# Patient Record
Sex: Female | Born: 1970 | Race: White | Hispanic: No | Marital: Married | Smoking: Former smoker
Health system: Southern US, Community
[De-identification: ages and names within clinical notes are randomized; demographics above are authoritative.]

## PROBLEM LIST (undated history)

## (undated) DIAGNOSIS — M7500 Adhesive capsulitis of unspecified shoulder: Secondary | ICD-10-CM

## (undated) DIAGNOSIS — E039 Hypothyroidism, unspecified: Secondary | ICD-10-CM

## (undated) DIAGNOSIS — M25519 Pain in unspecified shoulder: Secondary | ICD-10-CM

## (undated) DIAGNOSIS — N764 Abscess of vulva: Principal | ICD-10-CM

## (undated) DIAGNOSIS — E079 Disorder of thyroid, unspecified: Secondary | ICD-10-CM

## (undated) DIAGNOSIS — R519 Headache, unspecified: Secondary | ICD-10-CM

## (undated) DIAGNOSIS — Z9889 Other specified postprocedural states: Secondary | ICD-10-CM

## (undated) DIAGNOSIS — Z8619 Personal history of other infectious and parasitic diseases: Secondary | ICD-10-CM

## (undated) DIAGNOSIS — I890 Lymphedema, not elsewhere classified: Secondary | ICD-10-CM

## (undated) DIAGNOSIS — A599 Trichomoniasis, unspecified: Secondary | ICD-10-CM

## (undated) DIAGNOSIS — R87619 Unspecified abnormal cytological findings in specimens from cervix uteri: Secondary | ICD-10-CM

## (undated) HISTORY — DX: Lymphedema, not elsewhere classified: I89.0

## (undated) HISTORY — PX: OTHER SURGICAL HISTORY: SHX169

## (undated) HISTORY — DX: Other specified postprocedural states: Z98.890

## (undated) HISTORY — DX: Adhesive capsulitis of unspecified shoulder: M75.00

## (undated) HISTORY — PX: KNEE SURGERY: SHX244

## (undated) HISTORY — DX: Trichomoniasis, unspecified: A59.9

## (undated) HISTORY — DX: Disorder of thyroid, unspecified: E07.9

## (undated) HISTORY — DX: Unspecified abnormal cytological findings in specimens from cervix uteri: R87.619

## (undated) HISTORY — DX: Headache, unspecified: R51.9

## (undated) HISTORY — DX: Hypothyroidism, unspecified: E03.9

## (undated) HISTORY — DX: Personal history of other infectious and parasitic diseases: Z86.19

## (undated) HISTORY — PX: TONSILLECTOMY AND ADENOIDECTOMY: SHX28

## (undated) HISTORY — DX: Abscess of vulva: N76.4

## (undated) HISTORY — DX: Pain in unspecified shoulder: M25.519

---

## 2013-04-14 ENCOUNTER — Encounter: Payer: Self-pay | Admitting: Family Medicine

## 2014-05-10 ENCOUNTER — Encounter: Payer: Self-pay | Admitting: *Deleted

## 2014-05-10 ENCOUNTER — Encounter: Payer: Self-pay | Admitting: Obstetrics & Gynecology

## 2014-07-08 DIAGNOSIS — E66811 Obesity, class 1: Secondary | ICD-10-CM | POA: Insufficient documentation

## 2014-07-08 DIAGNOSIS — M1990 Unspecified osteoarthritis, unspecified site: Secondary | ICD-10-CM | POA: Insufficient documentation

## 2014-11-09 DIAGNOSIS — Z903 Acquired absence of stomach [part of]: Secondary | ICD-10-CM | POA: Insufficient documentation

## 2014-11-09 DIAGNOSIS — K219 Gastro-esophageal reflux disease without esophagitis: Secondary | ICD-10-CM | POA: Insufficient documentation

## 2015-02-02 ENCOUNTER — Other Ambulatory Visit: Payer: Self-pay | Admitting: Adult Health

## 2015-02-08 ENCOUNTER — Ambulatory Visit (INDEPENDENT_AMBULATORY_CARE_PROVIDER_SITE_OTHER): Payer: BLUE CROSS/BLUE SHIELD | Admitting: Adult Health

## 2015-02-08 ENCOUNTER — Other Ambulatory Visit (HOSPITAL_COMMUNITY)
Admission: RE | Admit: 2015-02-08 | Discharge: 2015-02-08 | Disposition: A | Discharge status: Home / Self Care | Payer: BLUE CROSS/BLUE SHIELD | Source: Ambulatory Visit | Attending: Adult Health | Admitting: Adult Health

## 2015-02-08 ENCOUNTER — Encounter: Payer: Self-pay | Admitting: Adult Health

## 2015-02-08 VITALS — BP 104/60 | HR 64 | Ht 65.5 in | Wt 213.5 lb

## 2015-02-08 DIAGNOSIS — Z1212 Encounter for screening for malignant neoplasm of rectum: Secondary | ICD-10-CM

## 2015-02-08 DIAGNOSIS — Z113 Encounter for screening for infections with a predominantly sexual mode of transmission: Secondary | ICD-10-CM | POA: Insufficient documentation

## 2015-02-08 DIAGNOSIS — Z01419 Encounter for gynecological examination (general) (routine) without abnormal findings: Secondary | ICD-10-CM | POA: Insufficient documentation

## 2015-02-08 DIAGNOSIS — E039 Hypothyroidism, unspecified: Secondary | ICD-10-CM | POA: Insufficient documentation

## 2015-02-08 DIAGNOSIS — Z1151 Encounter for screening for human papillomavirus (HPV): Secondary | ICD-10-CM | POA: Diagnosis present

## 2015-02-08 HISTORY — DX: Hypothyroidism, unspecified: E03.9

## 2015-02-08 LAB — HEMOCCULT GUIAC POC 1CARD (OFFICE): Fecal Occult Blood, POC: NEGATIVE

## 2015-02-08 MED ORDER — LEVOTHYROXINE SODIUM 50 MCG PO TABS: 50.0000 ug | ORAL_TABLET | Freq: Every day | ORAL | Status: DC

## 2015-02-08 NOTE — Patient Instructions (Signed)
Physical in 1 year Pap in 3 years if normal and negative HPV Mammogram yearly

## 2015-02-08 NOTE — Progress Notes (Signed)
Patient ID: Ashley MendsCharlene Ray, female   DOB: 01/20/1971, 44 y.o.   MRN: [id number] History of Present Illness:  Westley HummerCharlene is a 44 year old white female, married in for well woman gyn exam and pap,she is new to this practice. She moved to [place], has no PCP yet.  Current Medications, Allergies, Past Medical History, Past Surgical History, Family History and Social History were reviewed in [facility] electronic medical record.     Review of Systems: Patient denies any headaches, hearing loss, fatigue, blurred vision, shortness of breath, chest pain, abdominal pain, problems with bowel movements, urination, or intercourse. No joint pain or mood swings.She had sleeve surgery in April.Has lost about 40 lbs.    Physical Exam:BP 104/60 mmHg  Pulse 64  Ht 5' 5.5" (1.664 m)  Wt 213 lb 8 oz (96.843 kg)  BMI 34.98 kg/m2  LMP 02/01/2015 General:  Well developed, well nourished, no acute distress Skin:  Warm and dry,tan Neck:  Midline trachea, normal thyroid, good ROM, no lymphadenopathy Lungs; Clear to auscultation bilaterally Breast:  No dominant palpable mass, retraction, or nipple discharge Cardiovascular: Regular rate and rhythm Abdomen:  Soft, non tender, no hepatosplenomegaly Pelvic:  External genitalia is normal in appearance, no lesions.  The vagina is normal in appearance. Urethra has no lesions or masses. The cervix is bulbous.Pap with HPV and GC/CHL performed. Uterus is felt to be normal size, shape, and contour.  No adnexal masses or tenderness noted.Bladder is non tender, no masses felt. Rectal: Good sphincter tone, no polyps, or hemorrhoids felt.  Hemoccult negative. Extremities/musculoskeletal:  No swelling or varicosities noted, no clubbing or cyanosis Psych:  No mood changes, alert and cooperative,seems happy   Impression: Well woman gyn exam with pap Hypothyroid     Plan: Check TSH Refilled synthroid 50 mcg #90 take 1 daily no refills Physical in 1  year Pap in 3 years of normal with negative HPV Mammogram yearly, [place] one in [place] Colonoscopy at 50

## 2015-02-09 ENCOUNTER — Telehealth: Payer: Self-pay | Admitting: Adult Health

## 2015-02-09 LAB — TSH: TSH: 1.59 u[IU]/mL (ref 0.450–4.500)

## 2015-02-09 LAB — CYTOLOGY - PAP

## 2015-02-09 NOTE — Telephone Encounter (Signed)
Left message TSH was good, cal if needs refills on meds

## 2015-04-11 ENCOUNTER — Telehealth: Payer: Self-pay | Admitting: *Deleted

## 2015-04-11 NOTE — Telephone Encounter (Signed)
No voice mail box.

## 2015-04-11 NOTE — Telephone Encounter (Signed)
Pt aware pap normal, she noticed husband had white spot in underwear, could be semen, if he has any itching or burning needs to be checked

## 2015-07-05 ENCOUNTER — Encounter: Payer: Self-pay | Admitting: Adult Health

## 2015-07-05 ENCOUNTER — Telehealth: Payer: Self-pay | Admitting: Adult Health

## 2015-07-05 ENCOUNTER — Ambulatory Visit (INDEPENDENT_AMBULATORY_CARE_PROVIDER_SITE_OTHER): Payer: BLUE CROSS/BLUE SHIELD | Admitting: Adult Health

## 2015-07-05 ENCOUNTER — Ambulatory Visit: Payer: BLUE CROSS/BLUE SHIELD | Admitting: Women's Health

## 2015-07-05 VITALS — BP 104/80 | HR 62 | Ht 65.0 in | Wt 186.0 lb

## 2015-07-05 DIAGNOSIS — Z139 Encounter for screening, unspecified: Secondary | ICD-10-CM

## 2015-07-05 DIAGNOSIS — N764 Abscess of vulva: Secondary | ICD-10-CM | POA: Insufficient documentation

## 2015-07-05 DIAGNOSIS — M25511 Pain in right shoulder: Secondary | ICD-10-CM | POA: Diagnosis not present

## 2015-07-05 DIAGNOSIS — M25519 Pain in unspecified shoulder: Secondary | ICD-10-CM | POA: Insufficient documentation

## 2015-07-05 HISTORY — DX: Pain in unspecified shoulder: M25.519

## 2015-07-05 HISTORY — DX: Abscess of vulva: N76.4

## 2015-07-05 MED ORDER — CYCLOBENZAPRINE HCL 5 MG PO TABS: 5.0000 mg | ORAL_TABLET | Freq: Three times a day (TID) | ORAL | Status: DC | PRN

## 2015-07-05 MED ORDER — SULFAMETHOXAZOLE-TRIMETHOPRIM 800-160 MG PO TABS: 1.0000 | ORAL_TABLET | Freq: Two times a day (BID) | ORAL | Status: DC

## 2015-07-05 NOTE — Progress Notes (Signed)
Subjective:     Patient ID: Ashley Ray, female   DOB: [birth year], 44 y.o.   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  Review of Systems Patient denies any headaches, hearing loss, fatigue, blurred vision, shortness of breath, chest pain, abdominal pain, problems with bowel movements, urination, or intercourse. No joint pain or mood swings.See HPI for positives. Reviewed past medical,surgical, social and family history. Reviewed medications and allergies.     Objective:   Physical Exam BP 104/80 mmHg  Pulse 62  Ht 5\' 5"  (1.651 m)  Wt 186 lb (84.369 kg)  BMI 30.95 kg/m2  LMP 06/11/2015 (Approximate) Skin warm and dry.Pelvic: external genitalia is normal in appearance, except has abscess right inner labia that has popped, still red,vagina: has good color, moisture and rugae,urethra has no lesions or masses noted, cervix:smooth and bulbous, uterus: normal size, shape and contour, non tender, no masses felt, adnexa: no masses or tenderness noted. Bladder is non tender and no masses felt, has good ROM right shoulder, waist 32.5 inches, form filled out for insurance and will get labs for insurance too.    Assessment:     Vulva abscess Pain in right shoulder area    Plan:     Try ice 10 minutes 3-4 x daily prn, see Dr Ladona Ridgelaylor, chiropractor  Rx flexeril 5 mg #30 take 1 tid prn no refills Rx septra ds #20 take 1 bid x 10 days, no refills Check CBC,CMP, and lipids, will talk in am about labs    Follow up prn

## 2015-07-05 NOTE — Patient Instructions (Signed)
Try ice  To shoulder 10 minutes on about 3-4 times daily prn Take flexeril prn  See Dr Ladona Ridgelaylor  Follow up prn

## 2015-07-06 LAB — LIPID PANEL
Chol/HDL Ratio: 1.9 ratio units (ref 0.0–4.4)
Cholesterol, Total: 137 mg/dL (ref 100–199)
HDL: 71 mg/dL (ref >39)
LDL Calculated: 52 mg/dL (ref 0–99)
Triglycerides: 69 mg/dL (ref 0–149)
VLDL CHOLESTEROL CAL: 14 mg/dL (ref 5–40)

## 2015-07-06 LAB — CBC
HEMOGLOBIN: 13.5 g/dL (ref 11.1–15.9)
Hematocrit: 40.3 % (ref 34.0–46.6)
MCH: 29.5 pg (ref 26.6–33.0)
MCHC: 33.5 g/dL (ref 31.5–35.7)
MCV: 88 fL (ref 79–97)
Platelets: 228 10*3/uL (ref 150–379)
RBC: 4.58 x10E6/uL (ref 3.77–5.28)
RDW: 13.3 % (ref 12.3–15.4)
WBC: 5.4 10*3/uL (ref 3.4–10.8)

## 2015-07-06 LAB — COMPREHENSIVE METABOLIC PANEL
ALK PHOS: 74 IU/L (ref 39–117)
ALT: 23 IU/L (ref 0–32)
AST: 18 IU/L (ref 0–40)
Albumin/Globulin Ratio: 1.6 (ref 1.1–2.5)
Albumin: 4.2 g/dL (ref 3.5–5.5)
BUN/Creatinine Ratio: 21 (ref 9–23)
BUN: 15 mg/dL (ref 6–24)
Bilirubin Total: 0.5 mg/dL (ref 0.0–1.2)
CO2: 25 mmol/L (ref 18–29)
CREATININE: 0.72 mg/dL (ref 0.57–1.00)
Calcium: 9.3 mg/dL (ref 8.7–10.2)
Chloride: 102 mmol/L (ref 96–106)
GFR calc Af Amer: 118 mL/min/{1.73_m2} (ref >59)
GFR calc non Af Amer: 102 mL/min/{1.73_m2} (ref >59)
GLUCOSE: 90 mg/dL (ref 65–99)
Globulin, Total: 2.6 g/dL (ref 1.5–4.5)
Potassium: 4.6 mmol/L (ref 3.5–5.2)
Sodium: 138 mmol/L (ref 134–144)
Total Protein: 6.8 g/dL (ref 6.0–8.5)

## 2015-07-06 NOTE — Telephone Encounter (Signed)
Pt aware labs normal, will print her a copy

## 2015-07-26 ENCOUNTER — Telehealth: Payer: Self-pay | Admitting: Adult Health

## 2015-07-26 MED ORDER — SULFAMETHOXAZOLE-TRIMETHOPRIM 800-160 MG PO TABS: 1.0000 | ORAL_TABLET | Freq: Two times a day (BID) | ORAL | Status: DC

## 2015-07-26 NOTE — Telephone Encounter (Signed)
Pt called stating that she would like a phone call from CanbyJennifer regarding the same issues she had last time. Please contact pt

## 2015-07-26 NOTE — Telephone Encounter (Signed)
Has another boil and it popped, will rx septra ds bid for 14 days, if returns make appt

## 2015-10-27 ENCOUNTER — Telehealth: Payer: Self-pay | Admitting: Adult Health

## 2015-10-27 MED ORDER — LEVOTHYROXINE SODIUM 50 MCG PO TABS: 50.0000 ug | ORAL_TABLET | Freq: Every day | ORAL | Status: DC

## 2015-10-27 NOTE — Telephone Encounter (Signed)
Will refill synthroid, check labs in July

## 2015-10-27 NOTE — Telephone Encounter (Signed)
Spoke with pt. Pt needs a refill on thyroid med. Thanks!! JSY

## 2016-02-06 ENCOUNTER — Other Ambulatory Visit: Payer: Self-pay | Admitting: Adult Health

## 2016-03-07 ENCOUNTER — Encounter: Payer: Self-pay | Admitting: Adult Health

## 2016-03-07 ENCOUNTER — Ambulatory Visit (INDEPENDENT_AMBULATORY_CARE_PROVIDER_SITE_OTHER): Payer: BLUE CROSS/BLUE SHIELD | Admitting: Adult Health

## 2016-03-07 VITALS — BP 110/70 | HR 64 | Ht 66.0 in | Wt 172.0 lb

## 2016-03-07 DIAGNOSIS — Z1211 Encounter for screening for malignant neoplasm of colon: Secondary | ICD-10-CM

## 2016-03-07 DIAGNOSIS — Z01419 Encounter for gynecological examination (general) (routine) without abnormal findings: Secondary | ICD-10-CM

## 2016-03-07 DIAGNOSIS — E039 Hypothyroidism, unspecified: Secondary | ICD-10-CM

## 2016-03-07 LAB — HEMOCCULT GUIAC POC 1CARD (OFFICE): FECAL OCCULT BLD: NEGATIVE

## 2016-03-07 MED ORDER — HYDROCORTISONE ACE-PRAMOXINE 1-1 % RE CREA: 1.0000 "application " | TOPICAL_CREAM | Freq: Two times a day (BID) | RECTAL | 0 refills | Status: DC

## 2016-03-07 NOTE — Patient Instructions (Signed)
Physical in 1 year Mammogram yearly [phone]  Call [facility] about legs

## 2016-03-07 NOTE — Progress Notes (Signed)
Patient ID: Ashley MendsCharlene Ray, female   DOB: 07/24/1970, 45 y.o.   MRN: [id number] History of Present Illness: Ashley HummerCharlene is a 45 year old white female in for a well woman gyn exam, she had a normal pap with negative HPV 01/29/15.She is complaining of rectal itch and also veins in legs.She is sp gastric sleeve and has lost about 86 lbs.   Current Medications, Allergies, Past Medical History, Past Surgical History, Family History and Social History were reviewed in [facility] electronic medical record.     Review of Systems: Patient denies any headaches, hearing loss, fatigue, blurred vision, shortness of breath, chest pain, abdominal pain, problems with bowel movements, urination, or intercourse. No joint pain or mood swings.See HPI for p[ositives.    Physical Exam:BP 110/70 (BP Location: Left Arm, Patient Position: Sitting, Cuff Size: Normal)   Pulse 64   Ht 5\' 6"  (1.676 m)   Wt 172 lb (78 kg)   LMP 02/10/2016 (Approximate)   BMI 27.76 kg/m  General:  Well developed, well nourished, no acute distress Skin:  Warm and dry,tan Neck:  Midline trachea, normal thyroid, good ROM, no lymphadenopathy Lungs; Clear to auscultation bilaterally Breast:  No dominant palpable mass, retraction, or nipple discharge Cardiovascular: Regular rate and rhythm Abdomen:  Soft, non tender, no hepatosplenomegaly Pelvic:  External genitalia is normal in appearance, no lesions.  The vagina is normal in appearance. Urethra has no lesions or masses. The cervix is bulbous.  Uterus is felt to be normal size, shape, and contour.  No adnexal masses or tenderness noted.Bladder is non tender, no masses felt. Rectal: Good sphincter tone, no polyps, or hemorrhoids felt.  Hemoccult negative. Extremities/musculoskeletal:  No swelling noted, no clubbing or cyanosis,she does have spider veins and varicose veins more on the left leg  Psych:  No mood changes, alert and cooperative,seems happy   Impression: Well woman exam  with routine gynecological exam - Plan: POCT occult blood stool  Hypothyroidism, unspecified hypothyroidism type - Plan: TSH     Plan: TSH Physical in 1 year Mammogram yearly Call [facility]  Continue current synthroid, will talk when labs back Use wet wipes and try analpram HC prn

## 2016-03-08 ENCOUNTER — Telehealth: Payer: Self-pay | Admitting: Adult Health

## 2016-03-08 LAB — TSH: TSH: 1.17 u[IU]/mL (ref 0.450–4.500)

## 2016-03-08 MED ORDER — LEVOTHYROXINE SODIUM 50 MCG PO TABS: 50.0000 ug | ORAL_TABLET | Freq: Every day | ORAL | 3 refills | Status: DC

## 2016-03-08 NOTE — Telephone Encounter (Signed)
Pt aware TSH normal, will refill synthroid x 1 year

## 2016-07-07 ENCOUNTER — Other Ambulatory Visit: Payer: Self-pay | Admitting: Adult Health

## 2016-08-10 ENCOUNTER — Telehealth: Payer: Self-pay | Admitting: *Deleted

## 2016-08-10 MED ORDER — LEVOTHYROXINE SODIUM 50 MCG PO TABS: 50.0000 ug | ORAL_TABLET | Freq: Every day | ORAL | 3 refills | Status: DC

## 2016-08-10 NOTE — Telephone Encounter (Signed)
Spoke with pt. Pt needs Levothyroxine sent to [facility]. It's cheaper to get there than local pharmacy. Thanks! JSY

## 2016-08-10 NOTE — Telephone Encounter (Signed)
[facility]

## 2016-10-08 ENCOUNTER — Telehealth: Payer: Self-pay | Admitting: Obstetrics & Gynecology

## 2016-10-08 NOTE — Telephone Encounter (Signed)
Pt would like to speak to someone re: possible yeast infection.

## 2016-10-08 NOTE — Telephone Encounter (Signed)
Has discharge redness and itching, to come in tomorrow at 11:15 am.

## 2016-10-08 NOTE — Telephone Encounter (Signed)
Patient called with complaints of yellow vaginal discharge, irritation, redness and itching. Patient is not able to be seen until next week. Please advise.

## 2016-10-09 ENCOUNTER — Ambulatory Visit (INDEPENDENT_AMBULATORY_CARE_PROVIDER_SITE_OTHER): Payer: BLUE CROSS/BLUE SHIELD | Admitting: Adult Health

## 2016-10-09 ENCOUNTER — Encounter: Payer: Self-pay | Admitting: Adult Health

## 2016-10-09 VITALS — BP 118/60 | HR 77 | Ht 66.0 in | Wt 175.5 lb

## 2016-10-09 DIAGNOSIS — B379 Candidiasis, unspecified: Secondary | ICD-10-CM

## 2016-10-09 DIAGNOSIS — N898 Other specified noninflammatory disorders of vagina: Secondary | ICD-10-CM

## 2016-10-09 DIAGNOSIS — L298 Other pruritus: Secondary | ICD-10-CM

## 2016-10-09 DIAGNOSIS — N951 Menopausal and female climacteric states: Secondary | ICD-10-CM | POA: Diagnosis not present

## 2016-10-09 LAB — POCT WET PREP (WET MOUNT): WBC, Wet Prep HPF POC: POSITIVE

## 2016-10-09 MED ORDER — FLUCONAZOLE 150 MG PO TABS: ORAL_TABLET | ORAL | 1 refills | Status: DC

## 2016-10-09 NOTE — Progress Notes (Signed)
Subjective:     Patient ID: Ashley Ray, female   DOB: [birth year], 46 y.o.   MRN: [id number]  HPI Ashley Ray is a 46 year old white female, married in complaining of vaginal discharge with itching and irritation, vaginal dryness and irregular periods.  Review of Systems +vaginal itching +vaginal discharge Vaginal dryness Periods irregular Reviewed past medical,surgical, social and family history. Reviewed medications and allergies.     Objective:   Physical Exam BP 118/60 (BP Location: Left Arm, Patient Position: Sitting, Cuff Size: Normal)   Pulse 77   Ht 5\' 6"  (1.676 m)   Wt 175 lb 8 oz (79.6 kg)   LMP 09/08/2016   BMI 28.33 kg/m    Skin warm and dry.Pelvic: external genitalia is normal in appearance no lesions,but red in creases, vagina: white discharge without odor,urethra has no lesions or masses noted, cervix:smooth and bulbous, uterus: normal size, shape and contour, non tender, no masses felt, adnexa: no masses or tenderness noted. Bladder is non tender and no masses felt. Wet prep: + for yeast and +WBCs.GC/CHL obtained. Discussed probably peri menopause, discussed other symptoms.  Assessment:     1. Vaginal discharge   2. Vaginal itching   3. Yeast infection   4.      Perimenopause    Plan:    GC/CHL sent Meds ordered this encounter  Medications  . fluconazole (DIFLUCAN) 150 MG tablet    Sig: Take 1 now and repeat 1 in 3 days    Dispense:  2 tablet    Refill:  1    Order Specific Question:   Supervising Provider    Answer:   Duane LopeEURE, LUTHER H [[id number]]  Use unscented soap, like dial antibacterial Follow up prn

## 2016-10-10 ENCOUNTER — Telehealth: Payer: Self-pay | Admitting: Adult Health

## 2016-10-10 LAB — GC/CHLAMYDIA PROBE AMP
Chlamydia trachomatis, NAA: NEGATIVE
NEISSERIA GONORRHOEAE BY PCR: NEGATIVE

## 2016-10-10 NOTE — Telephone Encounter (Signed)
Left message that GC/CHL both negative  

## 2016-10-15 ENCOUNTER — Telehealth: Payer: Self-pay | Admitting: Adult Health

## 2016-10-15 ENCOUNTER — Ambulatory Visit: Payer: BLUE CROSS/BLUE SHIELD | Admitting: Adult Health

## 2016-10-15 NOTE — Telephone Encounter (Signed)
Pt called stating that she was here on Tuesday of last week and was given a prescription and she has a question regarding her medication. Please contact pt

## 2016-10-15 NOTE — Telephone Encounter (Signed)
Left message that the cream can make feel more irritated and can burn , but if continues let me know

## 2016-10-15 NOTE — Telephone Encounter (Signed)
Spoke with pt. Pt was seen 3/20 and was prescribed Diflucan. She took both doses then refilled and took 1 dose of 2nd prescription. Pharmacist recommended Miconazole cream, 3 day treatment. She started cream last night. Pt feels like she is worse. Please advise. Thanks!! JSY

## 2016-10-16 DIAGNOSIS — I8311 Varicose veins of right lower extremity with inflammation: Secondary | ICD-10-CM | POA: Diagnosis not present

## 2016-10-16 DIAGNOSIS — I8312 Varicose veins of left lower extremity with inflammation: Secondary | ICD-10-CM | POA: Diagnosis not present

## 2016-10-16 DIAGNOSIS — R600 Localized edema: Secondary | ICD-10-CM | POA: Diagnosis not present

## 2016-10-22 ENCOUNTER — Telehealth: Payer: Self-pay | Admitting: Adult Health

## 2016-10-22 NOTE — Telephone Encounter (Signed)
Spoke with pt. Pt has a vaginal discharge with odor that started last Wednesday. I advised she needs to be seen. Pt voiced understanding and call was transferred to front desk for appt. JSY

## 2016-10-23 ENCOUNTER — Ambulatory Visit (INDEPENDENT_AMBULATORY_CARE_PROVIDER_SITE_OTHER): Payer: BLUE CROSS/BLUE SHIELD | Admitting: Adult Health

## 2016-10-23 ENCOUNTER — Encounter: Payer: Self-pay | Admitting: Adult Health

## 2016-10-23 VITALS — BP 110/70 | HR 66 | Ht 66.0 in | Wt 179.5 lb

## 2016-10-23 DIAGNOSIS — N898 Other specified noninflammatory disorders of vagina: Secondary | ICD-10-CM

## 2016-10-23 DIAGNOSIS — A599 Trichomoniasis, unspecified: Secondary | ICD-10-CM

## 2016-10-23 DIAGNOSIS — L298 Other pruritus: Secondary | ICD-10-CM | POA: Diagnosis not present

## 2016-10-23 LAB — POCT WET PREP (WET MOUNT)
CLUE CELLS WET PREP WHIFF POC: POSITIVE
WBC, Wet Prep HPF POC: POSITIVE

## 2016-10-23 MED ORDER — METRONIDAZOLE 500 MG PO TABS: 500.0000 mg | ORAL_TABLET | Freq: Three times a day (TID) | ORAL | 0 refills | Status: DC

## 2016-10-23 NOTE — Patient Instructions (Signed)

## 2016-10-23 NOTE — Progress Notes (Signed)
Subjective:     Patient ID: Ashley Ray, female   DOB: 02/01/1971, 46 y.o.   MRN: [id number]  HPI Carollynn is a 46 year old white female, married in complaining of increased vaginal discharge and odor, she took the diflucan and used OTC meds without relief.Was treated for yeast 10/09/16, and had negative GC/CHL then.  Review of Systems +vaginal discharge +vaginal itching +vaginal odor Reviewed past medical,surgical, social and family history. Reviewed medications and allergies.     Objective:   Physical Exam BP 110/70 (BP Location: Left Arm, Patient Position: Sitting, Cuff Size: Normal)   Pulse 66   Ht  (1.676 m)   Wt 179 lb 8 oz (81.4 kg)   LMP 10/10/2016 (Approximate)   BMI 28.97 kg/m    Skin warm and dry.Pelvic: external genitalia is normal in appearance no lesions, vagina: greenish discharge with odor,urethra has no lesions or masses noted, cervix: bulbous, slightly friable, uterus: normal size, shape and contour, non tender, no masses felt, adnexa: no masses or tenderness noted. Bladder is non tender and no masses felt. Wet prep: + for clue cells and +WBCs, +trich.Explained trich to her and treatment, and transmission, she is teary. Face time 15 minutes with 50% counseling.  Assessment:     1. Vaginal discharge   2. Vaginal itching   3. Vaginal odor   4. Trichimoniasis       Plan:     Meds ordered this encounter  Medications  . metroNIDAZOLE (FLAGYL) 500 MG tablet    Sig: Take 1 tablet (500 mg total) by mouth 3 (three) times daily.    Dispense:  21 tablet    Refill:  0    Order Specific Question:   Supervising Provider    Answer:   Freada Bergeron Rx for husband Tymesha Ditmore for flagyl 500 mg #4 4 po now No sex, no alcohol For POC 11/05/16, and check HIV and RPR Review handouts on trich

## 2016-10-24 ENCOUNTER — Ambulatory Visit (INDEPENDENT_AMBULATORY_CARE_PROVIDER_SITE_OTHER): Payer: BLUE CROSS/BLUE SHIELD | Admitting: Adult Health

## 2016-10-24 ENCOUNTER — Encounter: Payer: Self-pay | Admitting: Adult Health

## 2016-10-24 VITALS — BP 122/70 | HR 68 | Ht 66.0 in | Wt 175.0 lb

## 2016-10-24 DIAGNOSIS — A599 Trichomoniasis, unspecified: Secondary | ICD-10-CM | POA: Diagnosis not present

## 2016-10-24 DIAGNOSIS — N898 Other specified noninflammatory disorders of vagina: Secondary | ICD-10-CM

## 2016-10-24 DIAGNOSIS — L298 Other pruritus: Secondary | ICD-10-CM

## 2016-10-24 LAB — POCT WET PREP (WET MOUNT)
CLUE CELLS WET PREP WHIFF POC: POSITIVE
WBC WET PREP: POSITIVE

## 2016-10-24 NOTE — Progress Notes (Signed)
Subjective:     Patient ID: Ashley Ray, female   DOB: Oct 11, 1970, 46 y.o.   MRN: [id number]  HPI Ashley Ray is a 46 year old white female, married back today after being diagnosed with trich yesterday, wanting further testing for trich.Has not taken meds yet, has discharge, odor and itching.  Review of Systems Vaginal discharge with odor and itching Reviewed past medical,surgical, social and family history. Reviewed medications and allergies.     Objective:   Physical Exam BP 122/70 (BP Location: Left Arm, Patient Position: Sitting, Cuff Size: Normal)   Pulse 68   Ht  (1.676 m)   Wt 175 lb (79.4 kg)   LMP 10/10/2016 (Approximate)   BMI 28.25 kg/m  Skin warm and dry.Pelvic: external genitalia is normal in appearance no lesions, vagina: greenish frothy discharge with odor,urethra has no lesions or masses noted, cervix:smooth and bulbous, and friable with Q tip. Wet prep: + for trich and +WBCs. GC/CHL/TRICH obtained.    Discussed trich with her again and Dr Emelda Fear in to tell her that it could be dormant, then flare.   Assessment:     1. Vaginal discharge   2. Trichimoniasis   3. Vaginal itching   4. Vaginal odor       Plan:    GC/CHL/TRICH NAA sent Take flagyl F/U as scheduled

## 2016-10-26 ENCOUNTER — Telehealth: Payer: Self-pay | Admitting: Adult Health

## 2016-10-26 LAB — CHLAMYDIA/GONOCOCCUS/TRICHOMONAS, NAA
CHLAMYDIA BY NAA: NEGATIVE
Gonococcus by NAA: NEGATIVE
Trich vag by NAA: POSITIVE — AB

## 2016-10-26 NOTE — Telephone Encounter (Signed)
Left message that trich was + test that was sent to lab

## 2016-11-05 ENCOUNTER — Telehealth: Payer: Self-pay | Admitting: *Deleted

## 2016-11-05 NOTE — Telephone Encounter (Signed)
Patient called stating she has appointment tomorrow for f/u with Victorino Dike but states she is on her period.  Spoke with Victorino Dike who stated patient could reschedule for when her period stops. Pt verbalized understanding. Will reschedule.

## 2016-11-06 ENCOUNTER — Ambulatory Visit: Payer: BLUE CROSS/BLUE SHIELD | Admitting: Adult Health

## 2016-11-12 ENCOUNTER — Ambulatory Visit: Payer: BLUE CROSS/BLUE SHIELD | Admitting: Adult Health

## 2016-11-14 ENCOUNTER — Ambulatory Visit: Payer: BLUE CROSS/BLUE SHIELD | Admitting: Adult Health

## 2016-11-26 ENCOUNTER — Ambulatory Visit: Payer: BLUE CROSS/BLUE SHIELD | Admitting: Adult Health

## 2016-11-27 ENCOUNTER — Ambulatory Visit: Payer: BLUE CROSS/BLUE SHIELD | Admitting: Adult Health

## 2016-12-10 ENCOUNTER — Ambulatory Visit (INDEPENDENT_AMBULATORY_CARE_PROVIDER_SITE_OTHER): Payer: BLUE CROSS/BLUE SHIELD | Admitting: Adult Health

## 2016-12-10 ENCOUNTER — Encounter: Payer: Self-pay | Admitting: Adult Health

## 2016-12-10 ENCOUNTER — Encounter (INDEPENDENT_AMBULATORY_CARE_PROVIDER_SITE_OTHER): Payer: Self-pay

## 2016-12-10 DIAGNOSIS — Z8619 Personal history of other infectious and parasitic diseases: Secondary | ICD-10-CM

## 2016-12-10 DIAGNOSIS — Z113 Encounter for screening for infections with a predominantly sexual mode of transmission: Secondary | ICD-10-CM | POA: Diagnosis not present

## 2016-12-10 HISTORY — DX: Personal history of other infectious and parasitic diseases: Z86.19

## 2016-12-10 LAB — POCT WET PREP (WET MOUNT)
CLUE CELLS WET PREP WHIFF POC: NEGATIVE
Trichomonas Wet Prep HPF POC: ABSENT

## 2016-12-10 NOTE — Progress Notes (Addendum)
Subjective:     Patient ID: Ashley Ray, female   DOB: 01/13/1971, 46 y.o.   MRN: [id number]  HPI Ashley Ray is a 46 year old white female, married in for POC for recent trich infection, she and husband took meds, has less discharge.   Review of Systems Patient denies any headaches, hearing loss, fatigue, blurred vision, shortness of breath, chest pain, abdominal pain, problems with bowel movements, urination, or intercourse. No joint pain or mood swings.Less discharge  Reviewed past medical,surgical, social and family history. Reviewed medications and allergies.     Objective:   Physical Exam BP 110/68 (BP Location: Left Arm, Patient Position: Sitting, Cuff Size: Normal)   Pulse (!) 51   Ht 5\' 6"  (1.676 m)   Wt 181 lb (82.1 kg)   LMP 12/06/2016   BMI 29.21 kg/m    Skin warm and dry.Pelvic: external genitalia is normal in appearance no lesions, vagina:scant white discharge without odor,urethra has no lesions or masses noted, cervix:smooth and bulbous, uterus: normal size, shape and contour, non tender, no masses felt, adnexa: no masses or tenderness noted. Bladder is non tender and no masses felt. Wet prep: few +WBCs, no clue cells or trich.   Assessment:     History of trichomoniasis, for proof of cure    Plan:     Follow up prn

## 2016-12-26 DIAGNOSIS — Z6828 Body mass index (BMI) 28.0-28.9, adult: Secondary | ICD-10-CM | POA: Diagnosis not present

## 2016-12-26 DIAGNOSIS — Z9884 Bariatric surgery status: Secondary | ICD-10-CM | POA: Diagnosis not present

## 2016-12-26 DIAGNOSIS — E663 Overweight: Secondary | ICD-10-CM | POA: Diagnosis not present

## 2016-12-26 DIAGNOSIS — Z903 Acquired absence of stomach [part of]: Secondary | ICD-10-CM | POA: Diagnosis not present

## 2016-12-26 DIAGNOSIS — E569 Vitamin deficiency, unspecified: Secondary | ICD-10-CM | POA: Diagnosis not present

## 2017-02-19 ENCOUNTER — Other Ambulatory Visit: Payer: Self-pay | Admitting: Adult Health

## 2017-07-30 ENCOUNTER — Other Ambulatory Visit: Payer: Self-pay | Admitting: Adult Health

## 2017-11-27 ENCOUNTER — Other Ambulatory Visit: Payer: Self-pay | Admitting: Adult Health

## 2017-11-27 DIAGNOSIS — Z1231 Encounter for screening mammogram for malignant neoplasm of breast: Secondary | ICD-10-CM

## 2017-11-29 ENCOUNTER — Ambulatory Visit (HOSPITAL_COMMUNITY): Payer: BLUE CROSS/BLUE SHIELD

## 2017-12-11 ENCOUNTER — Ambulatory Visit
Admission: RE | Admit: 2017-12-11 | Discharge: 2017-12-11 | Disposition: A | Discharge status: Home / Self Care | Payer: BLUE CROSS/BLUE SHIELD | Source: Ambulatory Visit | Attending: Adult Health | Admitting: Adult Health

## 2017-12-11 DIAGNOSIS — Z1231 Encounter for screening mammogram for malignant neoplasm of breast: Secondary | ICD-10-CM

## 2017-12-13 ENCOUNTER — Other Ambulatory Visit: Payer: Self-pay | Admitting: Adult Health

## 2017-12-13 DIAGNOSIS — R928 Other abnormal and inconclusive findings on diagnostic imaging of breast: Secondary | ICD-10-CM

## 2017-12-18 ENCOUNTER — Ambulatory Visit
Admission: RE | Admit: 2017-12-18 | Discharge: 2017-12-18 | Disposition: A | Discharge status: Home / Self Care | Payer: BLUE CROSS/BLUE SHIELD | Source: Ambulatory Visit | Attending: Adult Health | Admitting: Adult Health

## 2017-12-18 ENCOUNTER — Ambulatory Visit: Payer: BLUE CROSS/BLUE SHIELD

## 2017-12-18 DIAGNOSIS — R928 Other abnormal and inconclusive findings on diagnostic imaging of breast: Secondary | ICD-10-CM

## 2017-12-19 ENCOUNTER — Encounter: Payer: Self-pay | Admitting: Obstetrics and Gynecology

## 2017-12-19 ENCOUNTER — Ambulatory Visit (INDEPENDENT_AMBULATORY_CARE_PROVIDER_SITE_OTHER): Payer: BLUE CROSS/BLUE SHIELD | Admitting: Obstetrics and Gynecology

## 2017-12-19 ENCOUNTER — Other Ambulatory Visit (HOSPITAL_COMMUNITY)
Admission: RE | Admit: 2017-12-19 | Discharge: 2017-12-19 | Disposition: A | Discharge status: Home / Self Care | Payer: BLUE CROSS/BLUE SHIELD | Source: Ambulatory Visit | Attending: Obstetrics and Gynecology | Admitting: Obstetrics and Gynecology

## 2017-12-19 VITALS — BP 100/60 | HR 98 | Ht 66.0 in | Wt 175.6 lb

## 2017-12-19 DIAGNOSIS — Z01419 Encounter for gynecological examination (general) (routine) without abnormal findings: Secondary | ICD-10-CM | POA: Insufficient documentation

## 2017-12-19 DIAGNOSIS — B379 Candidiasis, unspecified: Secondary | ICD-10-CM

## 2017-12-19 DIAGNOSIS — E039 Hypothyroidism, unspecified: Secondary | ICD-10-CM | POA: Diagnosis not present

## 2017-12-19 DIAGNOSIS — Z01411 Encounter for gynecological examination (general) (routine) with abnormal findings: Secondary | ICD-10-CM | POA: Diagnosis not present

## 2017-12-19 DIAGNOSIS — N72 Inflammatory disease of cervix uteri: Secondary | ICD-10-CM

## 2017-12-19 MED ORDER — NYSTATIN-TRIAMCINOLONE 100000-0.1 UNIT/GM-% EX OINT: 1.0000 "application " | TOPICAL_OINTMENT | Freq: Two times a day (BID) | CUTANEOUS | 99 refills | Status: DC

## 2017-12-19 NOTE — Progress Notes (Signed)
Patient ID: Ashley Ray, female   DOB: 1971/02/18, 47 y.o.   MRN: [id number]  Assessment:  1. Annual Gyn Exam 2. Cervicitis 3. Yeast infection, partially treated 4. No evidence of trich Plan:  1. pap smear done, next pap due 1 year 2. return annually or prn 3. Annual mammogram advised after age 24  Subjective:  Ashley Ray is a 47 y.o. female G1P1001 who presents for annual exam. Patient's last menstrual period was 12/03/2017.  The patient's last pap was 02/08/2015 and was normal. The patient believes she has a yeast infection, adding she recently started using a different bar of soap. She was given diflucan in the past and still had some remaining. She took her prescribed diflucan and it has mildly helped, but she still endorses itching. She would like STD testing since she has a hx of trichomoniasis.    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   The following portions of the patient's history were reviewed and updated as appropriate: allergies, current medications, past family history, past medical history, past social history, past surgical history and problem list. Past Medical History:  Diagnosis Date  . Abnormal Pap smear of cervix   . History of trichomoniasis 12/10/2016  . Hypothyroid 02/08/2015  . Pain in shoulder region 07/05/2015  . Thyroid disease   . Trichimoniasis   . Vulvar abscess 07/05/2015    Past Surgical History:  Procedure Laterality Date  . bariatric sleeve    . KNEE SURGERY Bilateral   . TONSILLECTOMY AND ADENOIDECTOMY       Current Outpatient Medications:  .  CALCIUM PO, Take by mouth 2 (two) times daily., Disp: , Rfl:  .  fexofenadine (ALLEGRA) 180 MG tablet, Take 180 mg by mouth daily., Disp: , Rfl:  .  fluconazole (DIFLUCAN) 150 MG tablet, TAKE 1 TABLET BY MOUTH NOW AND REPEAT IN 3 DAYS, Disp: 2 tablet, Rfl: 1 .  levothyroxine (SYNTHROID, LEVOTHROID) 50 MCG tablet, TAKE 1 TABLET DAILY, Disp: 90 tablet, Rfl:  3 .  Multiple Vitamin (MULTIVITAMIN) tablet, Take 1 tablet by mouth 4 (four) times daily., Disp: , Rfl:  .  pramoxine-hydrocortisone (ANALPRAM-HC) 1-1 % rectal cream, Place 1 application rectally 2 (two) times daily. (Patient taking differently: Place 1 application rectally as needed. ), Disp: 20 g, Rfl: 0  Review of Systems Constitutional: negative Gastrointestinal: negative Genitourinary: negative  Objective:  Pulse 98   Ht  (1.676 m)   Wt 180 lb (81.6 kg)   LMP 12/03/2017   BMI 29.05 kg/m    BMI: Body mass index is 29.05 kg/m.  General Appearance: Alert, appropriate appearance for age. No acute distress HEENT: Grossly normal Neck / Thyroid:  Cardiovascular: RRR; normal S1, S2, no murmur Lungs: CTA bilaterally Back: No CVAT Breast Exam: linear streak at 10:00 recently evaluated as benign with 3-D u/s Gastrointestinal: Soft, non-tender, no masses or organomegaly Pelvic Exam:  External genitalia - normal Vagina - moderate clumpy white discharge  Cervix - friable, bleeds with pap, neg Uterus - tiny   Adnexa - negative Wet [place] - negative trich, occasional clue cell, epithelial, negative whiff   Rectovaginal: not indicated and guaiac negative stool obtained Lymphatic Exam: Non-palpable nodes in neck, clavicular, axillary, or inguinal regions  Skin: no rash or abnormalities Neurologic: Normal gait and speech, no tremor  Psychiatric: Alert and oriented, appropriate affect.  Urinalysis:Not done  Christin Bach. MD Pgr [phone] 3:10 PM  By signing my name below, I, Victorino Dike  Dewitt Hoes, attest that this documentation has been prepared under the direction and in the presence of Tilda Burrow, MD. Electronically Signed: Diona Browner, Medical Scribe. 12/19/17. 3:10 PM.  I personally performed the services described in this documentation, which was SCRIBED in my presence. The recorded information has been reviewed and considered accurate. It has been edited as  necessary during review. Tilda Burrow, MD

## 2017-12-20 LAB — THYROID PANEL WITH TSH
Free Thyroxine Index: 1.6 (ref 1.2–4.9)
T3 Uptake Ratio: 23 % — ABNORMAL LOW (ref 24–39)
T4, Total: 7 ug/dL (ref 4.5–12.0)
TSH: 1.39 u[IU]/mL (ref 0.450–4.500)

## 2017-12-24 LAB — CYTOLOGY - PAP
ADEQUACY: ABSENT
DIAGNOSIS: NEGATIVE
HPV (WINDOPATH): NOT DETECTED

## 2018-04-08 ENCOUNTER — Other Ambulatory Visit: Payer: Self-pay | Admitting: Adult Health

## 2018-05-02 ENCOUNTER — Ambulatory Visit: Payer: Self-pay | Admitting: Family Medicine

## 2018-05-06 ENCOUNTER — Ambulatory Visit (INDEPENDENT_AMBULATORY_CARE_PROVIDER_SITE_OTHER): Payer: BLUE CROSS/BLUE SHIELD | Admitting: Family Medicine

## 2018-05-06 ENCOUNTER — Encounter: Payer: Self-pay | Admitting: Family Medicine

## 2018-05-06 ENCOUNTER — Encounter: Payer: Self-pay | Admitting: General Practice

## 2018-05-06 VITALS — BP 108/70 | HR 60 | Ht 66.0 in | Wt 177.4 lb

## 2018-05-06 DIAGNOSIS — E039 Hypothyroidism, unspecified: Secondary | ICD-10-CM

## 2018-05-06 DIAGNOSIS — Z7689 Persons encountering health services in other specified circumstances: Secondary | ICD-10-CM | POA: Diagnosis not present

## 2018-05-06 DIAGNOSIS — Z9884 Bariatric surgery status: Secondary | ICD-10-CM | POA: Diagnosis not present

## 2018-05-06 MED ORDER — LEVOTHYROXINE SODIUM 50 MCG PO TABS: 50.0000 ug | ORAL_TABLET | Freq: Every day | ORAL | 6 refills | Status: DC

## 2018-05-06 NOTE — Progress Notes (Signed)
   Subjective:    Patient ID: Ashley Ray, female    DOB: [birth year], 47 y.o.   MRN: [id number]  HPI  Pt is here today as a new patient to establish care. She would like screening blood work. Pt has had weight loss surgery in April 2013 and would like her vitamin levels checked. Declines flu shot  Working with weight watchers, goal is to stay under 180 lbs.  Trying to choose healthy foods. Is planning on becoming a member of the [employer].   Pt sees Cyril Mourning, NP at GYN for her women's health. She would like [place] to take over her thyroid management.   Review of Systems  Constitutional: Negative for unexpected weight change.  Respiratory: Negative for shortness of breath.   Cardiovascular: Negative for chest pain.  Endocrine: Negative for cold intolerance and heat intolerance.  Skin:       Negative for changes to skin, hair, or nails       Objective:   Physical Exam  Constitutional: She is oriented to person, place, and time. She appears well-developed and well-nourished. No distress.  HENT:  Head: Normocephalic and atraumatic.  Neck: Neck supple. No thyromegaly present.  Cardiovascular: Normal rate, regular rhythm and normal heart sounds.  No murmur heard. Pulmonary/Chest: Effort normal and breath sounds normal. No respiratory distress.  Lymphadenopathy:    She has no cervical adenopathy.  Neurological: She is alert and oriented to person, place, and time.  Skin: Skin is warm and dry.  Psychiatric: She has a normal mood and affect.  Nursing note and vitals reviewed.     Assessment & Plan:  1. Encounter to establish care Pt here to establish care with a new PCP. Would like [place] to take over management of her thyroid. Requesting screening lab work as well as vitamin levels d/t previous weight loss surgery. Requesting records from her cardiologist in [facility], pt had stress test and cardiac cath done several years ago and would like [place] to review the results.  Significant family history for heart disease.   2. History of weight loss surgery Will call and let patient know when labs are ordered, she will get them done on Saturday.  Will research the necessary labs to monitor prior to ordering. ADDENDUM: The American Society for Metabolic and Bariatric Surgery guidelines for nutritional management recommend the following screenings: thiamin, vitamin B12, Folate, Iron studies, Vitamin D, Calcium, Zinc, and Copper. These will be ordered and the patient notified.  3. Hypothyroidism, unspecified type TSH stable from last GYN visit in May. Pt denies any adverse effects or new s/s. Will send in refills for levothyroxine. She will f/u in 6 months for recheck on this.   Dr. Lorin Picket was consulted on this case and is in agreement with the above plan. The patient will get records from her cardiologist The patient will do lab work as requested As attending physician to this patient visit, this patient was seen in conjunction with the nurse practitioner.  The history,physical and treatment plan was reviewed with the nurse practitioner and pertinent findings were verified with the patient.  Also the treatment plan was reviewed with the patient while they were present. SAL

## 2018-05-06 NOTE — Progress Notes (Signed)
   Subjective:    Patient ID: Ashley Ray, female    DOB: [birth year], 47 y.o.   MRN: [id number]  HPI    Review of Systems     Objective:   Physical Exam        Assessment & Plan:

## 2018-05-07 ENCOUNTER — Telehealth: Payer: Self-pay | Admitting: Family Medicine

## 2018-05-07 ENCOUNTER — Other Ambulatory Visit: Payer: Self-pay | Admitting: Family Medicine

## 2018-05-07 DIAGNOSIS — Z9884 Bariatric surgery status: Secondary | ICD-10-CM

## 2018-05-07 DIAGNOSIS — Z13220 Encounter for screening for lipoid disorders: Secondary | ICD-10-CM

## 2018-05-07 NOTE — Telephone Encounter (Signed)
Orders put in. Left message to return call to notify pt.  

## 2018-05-07 NOTE — Telephone Encounter (Signed)
Nurses, please call patient and let her know that we have done some research and recommend she have the following labs drawn (and please place these orders for her):  -Thiamin -Vitamin B12 -Folate -Serum Iron -CBC -TIBC -Ferritin -Vitamin D -Calcium -PTH -Serum zinc -serum copper -ceruloplasmin -Met 7 -Lipid -Liver function  Thank you!

## 2018-05-07 NOTE — Telephone Encounter (Signed)
Orders put in. Pt notified.  

## 2018-05-07 NOTE — Addendum Note (Signed)
Addended by: Metro Kung on: 05/07/2018 09:07 AM   Modules accepted: Orders

## 2018-05-09 DIAGNOSIS — Z9884 Bariatric surgery status: Secondary | ICD-10-CM | POA: Diagnosis not present

## 2018-05-09 DIAGNOSIS — Z13220 Encounter for screening for lipoid disorders: Secondary | ICD-10-CM | POA: Diagnosis not present

## 2018-05-13 LAB — CBC WITH DIFFERENTIAL/PLATELET
BASOS: 1 %
Basophils Absolute: 0 10*3/uL (ref 0.0–0.2)
EOS (ABSOLUTE): 0.1 10*3/uL (ref 0.0–0.4)
EOS: 1 %
HEMOGLOBIN: 13.1 g/dL (ref 11.1–15.9)
Hematocrit: 40.1 % (ref 34.0–46.6)
IMMATURE GRANS (ABS): 0 10*3/uL (ref 0.0–0.1)
IMMATURE GRANULOCYTES: 0 %
LYMPHS: 37 %
Lymphocytes Absolute: 1.6 10*3/uL (ref 0.7–3.1)
MCH: 29.8 pg (ref 26.6–33.0)
MCHC: 32.7 g/dL (ref 31.5–35.7)
MCV: 91 fL (ref 79–97)
MONOCYTES: 8 %
Monocytes Absolute: 0.4 10*3/uL (ref 0.1–0.9)
NEUTROS ABS: 2.3 10*3/uL (ref 1.4–7.0)
NEUTROS PCT: 53 %
Platelets: 196 10*3/uL (ref 150–450)
RBC: 4.39 x10E6/uL (ref 3.77–5.28)
RDW: 12.4 % (ref 12.3–15.4)
WBC: 4.3 10*3/uL (ref 3.4–10.8)

## 2018-05-13 LAB — ZINC: Zinc: 101 ug/dL (ref 56–134)

## 2018-05-13 LAB — LIPID PANEL
CHOLESTEROL TOTAL: 194 mg/dL (ref 100–199)
Chol/HDL Ratio: 2.3 ratio (ref 0.0–4.4)
HDL: 86 mg/dL (ref >39)
LDL Calculated: 89 mg/dL (ref 0–99)
TRIGLYCERIDES: 97 mg/dL (ref 0–149)
VLDL Cholesterol Cal: 19 mg/dL (ref 5–40)

## 2018-05-13 LAB — BASIC METABOLIC PANEL
BUN/Creatinine Ratio: 22 (ref 9–23)
BUN: 17 mg/dL (ref 6–24)
CALCIUM: 9.5 mg/dL (ref 8.7–10.2)
CO2: 25 mmol/L (ref 20–29)
Chloride: 104 mmol/L (ref 96–106)
Creatinine, Ser: 0.77 mg/dL (ref 0.57–1.00)
GFR calc Af Amer: 106 mL/min/{1.73_m2} (ref >59)
GFR calc non Af Amer: 92 mL/min/{1.73_m2} (ref >59)
GLUCOSE: 88 mg/dL (ref 65–99)
POTASSIUM: 4.2 mmol/L (ref 3.5–5.2)
Sodium: 141 mmol/L (ref 134–144)

## 2018-05-13 LAB — VITAMIN D 25 HYDROXY (VIT D DEFICIENCY, FRACTURES): Vit D, 25-Hydroxy: 37.6 ng/mL (ref 30.0–100.0)

## 2018-05-13 LAB — B12 AND FOLATE PANEL
Folate: 16 ng/mL (ref >3.0)
VITAMIN B 12: 1180 pg/mL (ref 232–1245)

## 2018-05-13 LAB — VITAMIN B1: Thiamine: 121.3 nmol/L (ref 66.5–200.0)

## 2018-05-13 LAB — HEPATIC FUNCTION PANEL
ALK PHOS: 68 IU/L (ref 39–117)
ALT: 21 IU/L (ref 0–32)
AST: 17 IU/L (ref 0–40)
Albumin: 4.5 g/dL (ref 3.5–5.5)
Bilirubin Total: 0.5 mg/dL (ref 0.0–1.2)
Bilirubin, Direct: 0.14 mg/dL (ref 0.00–0.40)
TOTAL PROTEIN: 6.7 g/dL (ref 6.0–8.5)

## 2018-05-13 LAB — COPPER, SERUM: COPPER: 123 ug/dL (ref 72–166)

## 2018-05-13 LAB — FERRITIN: FERRITIN: 116 ng/mL (ref 15–150)

## 2018-05-13 LAB — IRON AND TIBC
IRON SATURATION: 42 % (ref 15–55)
IRON: 140 ug/dL (ref 27–159)
Total Iron Binding Capacity: 333 ug/dL (ref 250–450)
UIBC: 193 ug/dL (ref 131–425)

## 2018-05-13 LAB — CERULOPLASMIN: CERULOPLASMIN: 25.5 mg/dL (ref 19.0–39.0)

## 2018-05-13 LAB — PTH, INTACT AND CALCIUM: PTH: 17 pg/mL (ref 15–65)

## 2018-05-14 ENCOUNTER — Other Ambulatory Visit: Payer: Self-pay | Admitting: Family Medicine

## 2018-05-14 DIAGNOSIS — Z79899 Other long term (current) drug therapy: Secondary | ICD-10-CM

## 2018-05-14 DIAGNOSIS — Z13220 Encounter for screening for lipoid disorders: Secondary | ICD-10-CM

## 2018-05-14 MED ORDER — ROSUVASTATIN CALCIUM 5 MG PO TABS: 5.0000 mg | ORAL_TABLET | Freq: Every day | ORAL | 2 refills | Status: DC

## 2018-06-23 ENCOUNTER — Telehealth: Payer: Self-pay | Admitting: Family Medicine

## 2018-06-23 NOTE — Telephone Encounter (Signed)
Patient advised per Mardella LaymanLindsey: if her headaches are severe then she should be seen for follow-up in the next few days, or if she has any red flag symptoms such as headache awakening her in the middle the night or vomiting then she should be seen as well.  Otherwise recommend that she stop the medication for the next 2 weeks and see if the headaches resolve.  If the headaches resolve at that time we can discuss other treatment options for cholesterol.  If headaches persist then she should be seen for follow-up.  If she would like to come in and discuss her treatment options at her convenience we can do that as well. Patient verbalized understanding and stated the headaches are not severe but persistent. Patient states she wants to stop the med for the next 2 weeks and see if the headaches improve and then schedule an office visit to discuss further management.

## 2018-06-23 NOTE — Telephone Encounter (Signed)
Pt has been taking rosuvastatin (CRESTOR) 5 MG tablet daily for about a month now. Since starting the medication she now has headaches daily. She would like to know if she should continue with the medication or just stop and have something else called in. If something else is [id number] [place], [place] - [address]

## 2018-06-23 NOTE — Telephone Encounter (Signed)
Please call patient, if her headaches are severe then she should be seen for follow-up in the next few days, or if she has any red flag symptoms such as headache awakening her in the middle the night or vomiting then she should be seen as well.  Otherwise recommend that she stop the medication for the next 2 weeks and see if the headaches resolve.  If the headaches resolve at that time we can discuss other treatment options for cholesterol.  If headaches persist then she should be seen for follow-up.  If she would like to come in and discuss her treatment options at her convenience we can do that as well. Thank you.

## 2018-07-25 ENCOUNTER — Ambulatory Visit: Payer: BLUE CROSS/BLUE SHIELD | Admitting: Family Medicine

## 2018-07-25 ENCOUNTER — Encounter: Payer: Self-pay | Admitting: Family Medicine

## 2018-07-25 VITALS — BP 110/80 | Temp 97.6°F | Ht 66.0 in | Wt 189.0 lb

## 2018-07-25 DIAGNOSIS — Z9889 Other specified postprocedural states: Secondary | ICD-10-CM

## 2018-07-25 DIAGNOSIS — M545 Low back pain, unspecified: Secondary | ICD-10-CM

## 2018-07-25 HISTORY — DX: Other specified postprocedural states: Z98.890

## 2018-07-25 LAB — POCT URINALYSIS DIPSTICK
Blood, UA: NEGATIVE
LEUKOCYTES UA: NEGATIVE
SPEC GRAV UA: 1.02 (ref 1.010–1.025)
pH, UA: 6 (ref 5.0–8.0)

## 2018-07-25 NOTE — Progress Notes (Signed)
   Subjective:    Patient ID: Ashley Ray, female    DOB: 1970-10-17, 48 y.o.   MRN: [id number]  HPI  Patient is here today with complaints of lower back pain. No burning,stinging nor pressure when urinating. She states the area is tender to touch. Symptoms started couple of weeks ago and does not remember injuring. Patient denies dysuria urinary frequency denies high fever chills sweats does relate low back pain denies radiating down the legs denies abdominal pain nausea vomiting diarrhea Results for orders placed or performed in visit on 07/25/18  POCT urinalysis dipstick  Result Value Ref Range   Color, UA     Clarity, UA     Glucose, UA     Bilirubin, UA     Ketones, UA     Spec Grav, UA 1.020 1.010 - 1.025   Blood, UA Negative    pH, UA 6.0 5.0 - 8.0   Protein, UA     Urobilinogen, UA     Nitrite, UA     Leukocytes, UA Negative Negative   Appearance     Odor       Review of Systems  Constitutional: Negative for activity change, appetite change and fatigue.  HENT: Negative for congestion.   Respiratory: Negative for cough.   Cardiovascular: Negative for chest pain.  Gastrointestinal: Negative for abdominal pain.  Skin: Negative for color change.  Neurological: Negative for headaches.  Psychiatric/Behavioral: Negative for behavioral problems.       Objective:   Physical Exam Vitals signs reviewed.  Constitutional:      General: She is not in acute distress. HENT:     Head: Normocephalic and atraumatic.  Eyes:     General:        Right eye: No discharge.        Left eye: No discharge.  Neck:     Trachea: No tracheal deviation.  Cardiovascular:     Rate and Rhythm: Normal rate and regular rhythm.     Heart sounds: Normal heart sounds. No murmur.  Pulmonary:     Effort: Pulmonary effort is normal. No respiratory distress.     Breath sounds: Normal breath sounds.  Lymphadenopathy:     Cervical: No cervical adenopathy.  Skin:    General: Skin is warm  and dry.  Neurological:     Mental Status: She is alert.     Coordination: Coordination normal.  Psychiatric:        Behavior: Behavior normal.      Good range of motion low back tenderness positive negative straight leg raise strength is good     Assessment & Plan:  Low back Stretching exercises shown May use Tylenol as needed Rest up over the next few days No need for any type of antibiotics currently Urinalysis looks negative Warning signs were discussed in detail If not better over the next 3 to 4 weeks follow-up for testing sooner if any problems

## 2018-12-10 DIAGNOSIS — R600 Localized edema: Secondary | ICD-10-CM | POA: Diagnosis not present

## 2018-12-10 DIAGNOSIS — Z713 Dietary counseling and surveillance: Secondary | ICD-10-CM | POA: Diagnosis not present

## 2018-12-10 DIAGNOSIS — Z903 Acquired absence of stomach [part of]: Secondary | ICD-10-CM | POA: Diagnosis not present

## 2019-01-05 DIAGNOSIS — Z713 Dietary counseling and surveillance: Secondary | ICD-10-CM | POA: Diagnosis not present

## 2019-01-05 DIAGNOSIS — Z903 Acquired absence of stomach [part of]: Secondary | ICD-10-CM | POA: Diagnosis not present

## 2019-01-06 ENCOUNTER — Telehealth: Payer: Self-pay | Admitting: Family Medicine

## 2019-01-06 ENCOUNTER — Encounter: Payer: Self-pay | Admitting: Family Medicine

## 2019-01-06 NOTE — Telephone Encounter (Signed)
Pt made aware note has been printed and once it is signed we will mail it to her.

## 2019-01-06 NOTE — Telephone Encounter (Signed)
Pt had a coworker test positive for covid. She went to [facility] in [place] to be tested today due to coming in contact with coworker. They are requesting we provide a work note stating she will be out of work until she gets the results back.

## 2019-01-06 NOTE — Telephone Encounter (Signed)
Ok plz do and I will sign

## 2019-01-29 ENCOUNTER — Other Ambulatory Visit: Payer: Self-pay | Admitting: Adult Health

## 2019-01-29 DIAGNOSIS — Z1231 Encounter for screening mammogram for malignant neoplasm of breast: Secondary | ICD-10-CM

## 2019-02-03 ENCOUNTER — Telehealth: Payer: Self-pay | Admitting: Family Medicine

## 2019-02-03 MED ORDER — LEVOTHYROXINE SODIUM 50 MCG PO TABS: 50.0000 ug | ORAL_TABLET | Freq: Every day | ORAL | 0 refills | Status: DC

## 2019-02-03 NOTE — Telephone Encounter (Signed)
Fax from [employer] wanting to switch from synthroid 53mcg one daily to euthyrox. Pt also called and states she is completely out of med and wants a 90 day supply because she will lose insurance end of July. Has wellness Friday this week and wants labs done. Last labs were lipid, liver, bmp, ferritin, vit d, vit b1, vit b12 and folate, tibc, cbc, ceruloplasin, serum copper, zinc, pth intact and calcium.

## 2019-02-03 NOTE — Telephone Encounter (Signed)
levothyroxine (SYNTHROID, LEVOTHROID) 50 MCG tablet   [facility]  Patient scheduled physical for Friday the 17th.  Is out of meds already.  Would like to get a 90 day supply because is losing insurance at the end of July.  Patient would also like orders place to have her labs done for her wellness as well as labs done to check her thyroid.

## 2019-02-03 NOTE — Telephone Encounter (Signed)
I would rec pt waits til she sees carolyn on fri to see which labs really are necessary many of these we don ot order regularly  May chane thry as requested

## 2019-02-03 NOTE — Telephone Encounter (Signed)
Since pt declining my rec to wait  will order cbc met 7 lip liv tsh b12

## 2019-02-03 NOTE — Telephone Encounter (Signed)
Contacted patient and informed her that provider would recommend that pt waits until she sees Hoyle Sauer to see which labs need to be ordered since most of the labs we do not order regularly. Pt states she has all those labs done before due to her having weight loss surgery. Pt states she would like her potassium and cholesterol checked. Please advise. Thank you . [facility] [place]. Pt verbalized understanding.

## 2019-02-06 ENCOUNTER — Encounter: Payer: Self-pay | Admitting: Nurse Practitioner

## 2019-02-06 ENCOUNTER — Other Ambulatory Visit: Payer: Self-pay

## 2019-02-06 ENCOUNTER — Ambulatory Visit (INDEPENDENT_AMBULATORY_CARE_PROVIDER_SITE_OTHER): Payer: BC Managed Care – PPO | Admitting: Nurse Practitioner

## 2019-02-06 VITALS — BP 118/68 | Temp 97.7°F | Ht 65.5 in | Wt 187.0 lb

## 2019-02-06 DIAGNOSIS — Z0000 Encounter for general adult medical examination without abnormal findings: Secondary | ICD-10-CM

## 2019-02-06 DIAGNOSIS — Z1151 Encounter for screening for human papillomavirus (HPV): Secondary | ICD-10-CM

## 2019-02-06 DIAGNOSIS — Z124 Encounter for screening for malignant neoplasm of cervix: Secondary | ICD-10-CM

## 2019-02-06 DIAGNOSIS — Z9884 Bariatric surgery status: Secondary | ICD-10-CM

## 2019-02-06 DIAGNOSIS — E039 Hypothyroidism, unspecified: Secondary | ICD-10-CM

## 2019-02-06 DIAGNOSIS — Z01419 Encounter for gynecological examination (general) (routine) without abnormal findings: Secondary | ICD-10-CM | POA: Diagnosis not present

## 2019-02-06 NOTE — Progress Notes (Signed)
Subjective:    Patient ID: Ashley Ray, female    DOB: Jun 29, 1971, 48 y.o.   MRN: [id number]  HPI The patient comes in today for a wellness visit.  A review of their health history was completed.  A review of medications was also completed.  Any needed refills; needs 90 day refill on Eurthyrox 50 mcg   Eating habits: NOT health conscious  Falls/  MVA accidents in past few months: none  Regular exercise: none  Specialist pt sees on regular basis: none  Preventative health issues were discussed.   Additional concerns: none  Presents for her wellness exam.  Same sexual partner who has had a vasectomy for birth control.  Regular cycles, normal flow.  Has a strong family history of cervical cancer, wishes to get Pap smear again this year.  Was unable to take Crestor due to headache.  Has a history of known blockage about 60%.  Asymptomatic.  Has a very active job, otherwise no regular exercise.  Has not done as well with her diet if she would like.  Has had a sleeve gastrectomy, has maintained almost 100 pounds of weight loss over the past several years.  Regular vision and dental exams.  Has significant varicose veins with some swelling in the legs at time especially with work.  ROS: No chest pain/ischemic type pain or shortness of breath.  Occasional mild edema.  Positive for varicose veins.  No constipation diarrhea or blood in her stool.  No nausea or vomiting.  No acid reflux heartburn or abdominal pain.  No vaginal discharge or pelvic pain.  No dysuria urgency or frequency. [facility] screen [facility] 02/06/2019 12/19/2017  Decreased Interest 0 3  Down, Depressed, Hopeless 0 0  PHQ - 2 Score 0 3  Altered sleeping - 3  Change in appetite - 0  Feeling bad or failure about yourself  - 0  Trouble concentrating - 0  Moving slowly or fidgety/restless - 0  Suicidal thoughts - 0  PHQ-9 Score - 6  Difficult doing work/chores - Not difficult at all       Objective:   Physical  Exam Constitutional:      General: She is not in acute distress.    Appearance: Normal appearance. She is well-developed.  Neck:     Musculoskeletal: Normal range of motion and neck supple.     Thyroid: No thyromegaly.     Trachea: No tracheal deviation.     Comments: Thyroid nontender to palpation.  No mass or goiter noted. Cardiovascular:     Rate and Rhythm: Normal rate and regular rhythm.     Heart sounds: Normal heart sounds. No murmur. No gallop.      Comments: Multiple varicosities noted, the largest ones are noted in the left leg particularly the lateral side.  No significant edema noted of the ankles today. Pulmonary:     Effort: Pulmonary effort is normal.     Breath sounds: Normal breath sounds.  Abdominal:     General: There is no distension.     Palpations: Abdomen is soft.     Tenderness: There is no abdominal tenderness.  Genitourinary:    General: Normal vulva.     Vagina: Normal. No vaginal discharge.     Comments: External GU no rashes or lesions noted.  Vagina no discharge.  Cervix normal limit in appearance.  Pap smear obtained.  No CMT.  Bimanual exam no tenderness or obvious masses. Lymphadenopathy:     Cervical: No  cervical adenopathy.  Skin:    General: Skin is warm and dry.     Findings: No rash.  Neurological:     Mental Status: She is alert and oriented to person, place, and time.  Psychiatric:        Mood and Affect: Mood normal.        Behavior: Behavior normal.        Thought Content: Thought content normal.        Judgment: Judgment normal.           Assessment & Plan:   Problem List Items Addressed This Visit      Endocrine   Hypothyroid   Relevant Orders   TSH     Other   Well woman exam with routine gynecological exam - Primary   Relevant Orders   Pap IG and HPV (high risk) DNA detection   CBC with Differential/Platelet   Comprehensive metabolic panel   Lipid panel   Ferritin   VITAMIN D 25 Hydroxy (Vit-D Deficiency,  Fractures)   Vitamin B12    Other Visit Diagnoses    Screening for cervical cancer       Relevant Orders   Pap IG and HPV (high risk) DNA detection   Screening for HPV (human papillomavirus)       Relevant Orders   Pap IG and HPV (high risk) DNA detection   History of gastric bypass       Relevant Orders   CBC with Differential/Platelet   Comprehensive metabolic panel   Lipid panel   Ferritin   VITAMIN D 25 Hydroxy (Vit-D Deficiency, Fractures)   Vitamin B12     Encouraged continued healthy diet and regular activity.  Patient takes a bariatric multivitamin daily.  Reviewed last lipid results.  Patient elects to hold on further cholesterol medicine at this time.  Routine labs ordered. Return in about 1 year (around 02/06/2020) for physical.

## 2019-02-06 NOTE — Patient Instructions (Signed)
[facility] [phone]

## 2019-02-07 ENCOUNTER — Encounter: Payer: Self-pay | Admitting: Nurse Practitioner

## 2019-02-11 ENCOUNTER — Ambulatory Visit: Payer: BLUE CROSS/BLUE SHIELD | Admitting: Family Medicine

## 2019-02-12 DIAGNOSIS — Z9884 Bariatric surgery status: Secondary | ICD-10-CM | POA: Diagnosis not present

## 2019-02-12 DIAGNOSIS — Z01419 Encounter for gynecological examination (general) (routine) without abnormal findings: Secondary | ICD-10-CM | POA: Diagnosis not present

## 2019-02-12 DIAGNOSIS — E039 Hypothyroidism, unspecified: Secondary | ICD-10-CM | POA: Diagnosis not present

## 2019-02-13 LAB — CBC WITH DIFFERENTIAL/PLATELET
Basophils Absolute: 0 10*3/uL (ref 0.0–0.2)
Basos: 0 %
EOS (ABSOLUTE): 0 10*3/uL (ref 0.0–0.4)
Eos: 1 %
Hematocrit: 38 % (ref 34.0–46.6)
Hemoglobin: 13 g/dL (ref 11.1–15.9)
Immature Grans (Abs): 0 10*3/uL (ref 0.0–0.1)
Immature Granulocytes: 0 %
Lymphocytes Absolute: 1.3 10*3/uL (ref 0.7–3.1)
Lymphs: 28 %
MCH: 30.2 pg (ref 26.6–33.0)
MCHC: 34.2 g/dL (ref 31.5–35.7)
MCV: 88 fL (ref 79–97)
Monocytes Absolute: 0.3 10*3/uL (ref 0.1–0.9)
Monocytes: 7 %
Neutrophils Absolute: 3 10*3/uL (ref 1.4–7.0)
Neutrophils: 64 %
Platelets: 180 10*3/uL (ref 150–450)
RBC: 4.31 x10E6/uL (ref 3.77–5.28)
RDW: 12.1 % (ref 11.7–15.4)
WBC: 4.7 10*3/uL (ref 3.4–10.8)

## 2019-02-13 LAB — COMPREHENSIVE METABOLIC PANEL
ALT: 25 IU/L (ref 0–32)
AST: 16 IU/L (ref 0–40)
Albumin/Globulin Ratio: 1.8 (ref 1.2–2.2)
Albumin: 4.2 g/dL (ref 3.8–4.8)
Alkaline Phosphatase: 53 IU/L (ref 39–117)
BUN/Creatinine Ratio: 17 (ref 9–23)
BUN: 13 mg/dL (ref 6–24)
Bilirubin Total: 0.5 mg/dL (ref 0.0–1.2)
CO2: 22 mmol/L (ref 20–29)
Calcium: 9.4 mg/dL (ref 8.7–10.2)
Chloride: 103 mmol/L (ref 96–106)
Creatinine, Ser: 0.77 mg/dL (ref 0.57–1.00)
GFR calc Af Amer: 106 mL/min/{1.73_m2} (ref >59)
GFR calc non Af Amer: 92 mL/min/{1.73_m2} (ref >59)
Globulin, Total: 2.4 g/dL (ref 1.5–4.5)
Glucose: 87 mg/dL (ref 65–99)
Potassium: 4.4 mmol/L (ref 3.5–5.2)
Sodium: 139 mmol/L (ref 134–144)
Total Protein: 6.6 g/dL (ref 6.0–8.5)

## 2019-02-13 LAB — VITAMIN D 25 HYDROXY (VIT D DEFICIENCY, FRACTURES): Vit D, 25-Hydroxy: 37.8 ng/mL (ref 30.0–100.0)

## 2019-02-13 LAB — LIPID PANEL
Chol/HDL Ratio: 2.8 ratio (ref 0.0–4.4)
Cholesterol, Total: 176 mg/dL (ref 100–199)
HDL: 62 mg/dL (ref >39)
LDL Calculated: 83 mg/dL (ref 0–99)
Triglycerides: 155 mg/dL — ABNORMAL HIGH (ref 0–149)
VLDL Cholesterol Cal: 31 mg/dL (ref 5–40)

## 2019-02-13 LAB — TSH: TSH: 2.58 u[IU]/mL (ref 0.450–4.500)

## 2019-02-13 LAB — VITAMIN B12: Vitamin B-12: 688 pg/mL (ref 232–1245)

## 2019-02-13 LAB — FERRITIN: Ferritin: 113 ng/mL (ref 15–150)

## 2019-02-20 ENCOUNTER — Telehealth: Payer: Self-pay | Admitting: Family Medicine

## 2019-02-20 NOTE — Telephone Encounter (Signed)
Just sent her a note. No changes in meds at this time.

## 2019-02-20 NOTE — Telephone Encounter (Signed)
Pt would like to know if anything needs to be called in medication wise since some of her lab work is a little off. Pt's last day of insurance is today. So she would need it called in today if that is the case and she states no one called her to discuss lab work and is unsure if it needs to be discussed.

## 2019-02-22 LAB — PAP IG AND HPV HIGH-RISK: HPV, high-risk: POSITIVE — AB

## 2019-03-05 ENCOUNTER — Other Ambulatory Visit: Payer: Self-pay | Admitting: Family Medicine

## 2019-03-05 NOTE — Telephone Encounter (Signed)
Pt would like a 90 day supply

## 2019-03-17 ENCOUNTER — Ambulatory Visit: Payer: BLUE CROSS/BLUE SHIELD

## 2019-04-30 ENCOUNTER — Other Ambulatory Visit: Payer: Self-pay

## 2019-04-30 ENCOUNTER — Ambulatory Visit
Admission: RE | Admit: 2019-04-30 | Discharge: 2019-04-30 | Disposition: A | Discharge status: Home / Self Care | Payer: BC Managed Care – PPO | Source: Ambulatory Visit | Attending: Adult Health | Admitting: Adult Health

## 2019-04-30 DIAGNOSIS — Z1231 Encounter for screening mammogram for malignant neoplasm of breast: Secondary | ICD-10-CM | POA: Diagnosis not present

## 2019-06-12 ENCOUNTER — Other Ambulatory Visit: Payer: Self-pay | Admitting: Family Medicine

## 2019-06-15 ENCOUNTER — Telehealth: Payer: Self-pay | Admitting: *Deleted

## 2019-06-15 MED ORDER — FLUCONAZOLE 150 MG PO TABS: ORAL_TABLET | ORAL | 1 refills | Status: DC

## 2019-06-15 NOTE — Telephone Encounter (Signed)
Pt left message that she wants a prescription for yeast.

## 2019-06-15 NOTE — Telephone Encounter (Signed)
wlll rx diflucan

## 2019-07-27 ENCOUNTER — Ambulatory Visit (INDEPENDENT_AMBULATORY_CARE_PROVIDER_SITE_OTHER): Payer: BC Managed Care – PPO | Admitting: Family Medicine

## 2019-07-27 ENCOUNTER — Encounter: Payer: Self-pay | Admitting: Family Medicine

## 2019-07-27 ENCOUNTER — Other Ambulatory Visit: Payer: Self-pay

## 2019-07-27 DIAGNOSIS — U071 COVID-19: Secondary | ICD-10-CM | POA: Diagnosis not present

## 2019-07-27 MED ORDER — HYDROCODONE-HOMATROPINE 5-1.5 MG/5ML PO SYRP: ORAL_SOLUTION | ORAL | 0 refills | Status: DC

## 2019-07-27 NOTE — Progress Notes (Signed)
   Subjective:  Audio only  Patient ID: Ashley Ray, female    DOB: [birth year], 49 y.o.   MRN: [id number]  HPIstarted getting a cold on Thursday night. Was also exposed to someone with covid 19. Had test done on Friday and got a call yesterday that it was positive. Having runny nose, congestion, diarrhea, headache, body aches. Taking brompheniramine-pseudoephedrine dm cough syrup.   Virtual Visit via Telephone Note  I connected with Ashley Ray on 07/27/19 at 10:00 AM EST by telephone and verified that I am speaking with the correct person using two identifiers.  Location: Patient: home Provider: office   I discussed the limitations, risks, security and privacy concerns of performing an evaluation and management service by telephone and the availability of in person appointments. I also discussed with the patient that there may be a patient responsible charge related to this service. The patient expressed understanding and agreed to proceed.   History of Present Illness:    Observations/Objective:   Assessment and Plan:   Follow Up Instructions:    I discussed the assessment and treatment plan with the patient. The patient was provided an opportunity to ask questions and all were answered. The patient agreed with the plan and demonstrated an understanding of the instructions.   The patient was advised to call back or seek an in-person evaluation if the symptoms worsen or if the condition fails to improve as anticipated.  I provided 18 minutes of non-face-to-face time during this encounter.   Patient has no shortness of breath.  Her chief complaint is recent very bad headaches.  Also been aching in the back pea-sized cannot take anti-inflammatories due to history of GI surgery      Review of Systems No vomiting no diarrhea no rash    Objective:   Physical Exam  Virtual      Assessment & Plan:  Impression COVID-19 infection.  General questions  answered.  Warning signs discussed carefully.  Add Hycodan as needed for headache and muscle aches.  Symptom care discussed

## 2019-07-28 ENCOUNTER — Emergency Department (HOSPITAL_COMMUNITY)
Admission: EM | Admit: 2019-07-28 | Discharge: 2019-07-28 | Disposition: A | Discharge status: Home / Self Care | Payer: BC Managed Care – PPO | Attending: Emergency Medicine | Admitting: Emergency Medicine

## 2019-07-28 ENCOUNTER — Other Ambulatory Visit: Payer: Self-pay | Admitting: *Deleted

## 2019-07-28 ENCOUNTER — Telehealth: Payer: Self-pay | Admitting: Family Medicine

## 2019-07-28 ENCOUNTER — Encounter (HOSPITAL_COMMUNITY): Payer: Self-pay | Admitting: Emergency Medicine

## 2019-07-28 ENCOUNTER — Emergency Department (HOSPITAL_COMMUNITY): Payer: BC Managed Care – PPO

## 2019-07-28 ENCOUNTER — Other Ambulatory Visit: Payer: Self-pay

## 2019-07-28 DIAGNOSIS — Z87891 Personal history of nicotine dependence: Secondary | ICD-10-CM | POA: Diagnosis not present

## 2019-07-28 DIAGNOSIS — Z79899 Other long term (current) drug therapy: Secondary | ICD-10-CM | POA: Diagnosis not present

## 2019-07-28 DIAGNOSIS — U071 COVID-19: Secondary | ICD-10-CM | POA: Diagnosis not present

## 2019-07-28 DIAGNOSIS — E039 Hypothyroidism, unspecified: Secondary | ICD-10-CM | POA: Diagnosis not present

## 2019-07-28 DIAGNOSIS — R0602 Shortness of breath: Secondary | ICD-10-CM | POA: Diagnosis not present

## 2019-07-28 DIAGNOSIS — R05 Cough: Secondary | ICD-10-CM | POA: Diagnosis not present

## 2019-07-28 MED ORDER — ALBUTEROL SULFATE HFA 108 (90 BASE) MCG/ACT IN AERS: 2.0000 | INHALATION_SPRAY | Freq: Four times a day (QID) | RESPIRATORY_TRACT | 0 refills | Status: DC | PRN

## 2019-07-28 NOTE — Telephone Encounter (Signed)
Tell her I had already read full er note and I good news xrqy is perfect and o2 sat was 98 per centFour doses a day the hycodan has to last three full d before we even consider another med. Can try albuterol two sprays qid. Pt should get a pulse oximeter at [facility] and monitor her own oxygen to make sure numbers remain 94 or higher

## 2019-07-28 NOTE — ED Triage Notes (Signed)
Pt states that she was diagnosed at [facility] with Covid on 07/24/19. States that she started to feel SOB this afternoon. Pt's O2 98% RA in triage, NAD noted, and able to speak in full sentences.

## 2019-07-28 NOTE — Telephone Encounter (Signed)
Had visit with yesterday and then went to ED this morning. Pt states she gets sob when walking. Breathing ok when sitting still. Had cxr at ED this morning and oxygen was 98% at hospital. [facility] if she should get an inhaler. Also see note about the hycodan below.   States she was taking it for her back pain. Would like to get something different for pain since pharm does not have hycodan. Pharm did get her 60 ml. States pharm said they would not be able to get the other 30 ml. Lost her taste and smell last night.

## 2019-07-28 NOTE — ED Provider Notes (Signed)
[facility] EMERGENCY DEPARTMENT Provider Note   CSN: [id number] Arrival date & time: 07/28/19  0001     History Chief Complaint  Patient presents with  . Shortness of Breath    Ashley Ray is a 49 y.o. female.  The history is provided by the patient.  Shortness of Breath Severity:  Moderate Onset quality:  Gradual Timing:  Intermittent Progression:  Worsening Chronicity:  New Relieved by:  None tried Worsened by:  Nothing Associated symptoms: chest pain, cough and headaches   Associated symptoms: no fever   Patient diagnosed with COVID-19 on July 24 2019 She began having symptoms on that day.  She reports cough, headache, diarrhea.  She is also had mild chest pain She denies fever.  She reports she was likely exposed on December 28  Since her diagnosis, she is feeling increasing shortness of breath.  She was told to be reevaluated if she felt short of breath No other new complaints     Past Medical History:  Diagnosis Date  . Abnormal Pap smear of cervix   . History of gastric surgery 07/25/2018   Gastric sleeve Dr Link Snuffer [facility] 2015  . History of trichomoniasis 12/10/2016  . Hypothyroid 02/08/2015  . Pain in shoulder region 07/05/2015  . Thyroid disease   . Trichimoniasis   . Vulvar abscess 07/05/2015    Patient Active Problem List   Diagnosis Date Noted  . History of gastric surgery 07/25/2018  . Well woman exam with routine gynecological exam 12/19/2017  . History of trichomoniasis 12/10/2016  . Vulvar abscess 07/05/2015  . Pain in shoulder region 07/05/2015  . Hypothyroid 02/08/2015    Past Surgical History:  Procedure Laterality Date  . bariatric sleeve    . KNEE SURGERY Bilateral   . TONSILLECTOMY AND ADENOIDECTOMY       OB History    Gravida  1   Para  1   Term  1   Preterm      AB      Living  1     SAB      TAB      Ectopic      Multiple      Live Births  1           Family History  Problem  Relation Age of Onset  . Cancer Mother        cervical  . Scleroderma Mother   . Hypertension Mother   . Raynaud syndrome Mother   . Other Mother        esophagus disease  . Cancer Father   . Deep vein thrombosis Father   . Heart attack Father   . Heart disease Father   . Diabetes Father   . Heart disease Maternal Grandmother   . Heart disease Paternal Grandmother   . Cancer Paternal Grandfather     Social History   Tobacco Use  . Smoking status: Former Smoker    Years: 15.00    Types: Cigarettes  . Smokeless tobacco: Never Used  Substance Use Topics  . Alcohol use: No  . Drug use: No    Home Medications Prior to Admission medications   Medication Sig Start Date End Date Taking? Authorizing Provider  brompheniramine-pseudoephedrine-DM 30-2-10 MG/5ML syrup Take 10 mLs by mouth 3 (three) times daily as needed. 07/25/19   [provider]  CALCIUM PO Take by mouth 2 (two) times daily.    [provider]  EUTHYROX 50 MCG tablet TAKE  1 TABLET BY MOUTH ONCE DAILY BEFORE BREAKFAST 06/12/19   Mikey Kirschner, MD  fexofenadine (ALLEGRA) 180 MG tablet Take 180 mg by mouth daily.    [provider]  HYDROcodone-homatropine (HYCODAN) 5-1.5 MG/5ML syrup Take one tsp every 6 hours prn cough. 07/27/19   Mikey Kirschner, MD  Multiple Vitamin (MULTIVITAMIN) tablet Take 1 tablet by mouth 4 (four) times daily.    [provider]  rosuvastatin (CRESTOR) 5 MG tablet Take 1 tablet (5 mg total) by mouth daily. Patient not taking: Reported on 02/06/2019 05/14/18   Cheyenne Adas, NP    Allergies    Topamax [topiramate] and Povidone iodine  Review of Systems   Review of Systems  Constitutional: Negative for fever.  Respiratory: Positive for cough and shortness of breath.   Cardiovascular: Positive for chest pain.  Gastrointestinal: Positive for diarrhea.  Neurological: Positive for headaches.  All other systems reviewed and are negative.   Physical  Exam Updated Vital Signs BP 136/77   Pulse 84   Temp 98.4 F (36.9 C)   Resp 18   Ht 1.676 m (5\' 6" )   Wt 76.7 kg   SpO2 98%   BMI 27.28 kg/m   Physical Exam CONSTITUTIONAL: Well developed/well nourished HEAD: Normocephalic/atraumatic EYES: EOMI ENMT: Mask in place NECK: supple no meningeal signs SPINE/BACK:entire spine nontender CV: S1/S2 noted, no murmurs/rubs/gallops noted LUNGS: Lungs are clear to auscultation bilaterally, no apparent distress ABDOMEN: soft, nontender NEURO: Pt is awake/alert/appropriate, moves all extremitiesx4.  No facial droop.   EXTREMITIES:  full ROM SKIN: warm, color normal PSYCH: no abnormalities of mood noted, alert and oriented to situation  ED Results / Procedures / Treatments   Labs (all labs ordered are listed, but only abnormal results are displayed) Labs Reviewed - No data to display  EKG None  Radiology DG Chest [facility] View  Result Date: 07/28/2019 CLINICAL DATA:  49 year old female with cough. EXAM: PORTABLE CHEST 1 VIEW COMPARISON:  None. FINDINGS: The heart size and mediastinal contours are within normal limits. Both lungs are clear. The visualized skeletal structures are unremarkable. IMPRESSION: No active disease. Electronically Signed   By: Anner Crete M.D.   On: 07/28/2019 03:14    Procedures Procedures   Medications Ordered in ED Medications - No data to display  ED Course  I have reviewed the triage vital signs and the nursing notes.  Pertinent  imaging results that were available during my care of the patient were reviewed by me and considered in my medical decision making (see chart for details).    MDM Rules/Calculators/A&P                      Patient presents for feeling short of breath.  She was recently diagnosed with COVID-19 She reports over the past day she has felt increasing shortness of breath.  She also had some cough and chest pain.  Patient is in no acute distress, pulse ox 98%.  Chest x-ray was  reviewed by myself and is negative.  I feel she is safe for discharge home. I did advise her that her symptoms could worsen over the next several days. We discussed strict return precautions.   Lanijah Warzecha was evaluated in Emergency Department on 07/28/2019 for the symptoms described in the history of present illness. She was evaluated in the context of the global COVID-19 pandemic, which necessitated consideration that the patient might be at risk for infection with the SARS-CoV-2 virus that causes  COVID-19. Institutional protocols and algorithms that pertain to the evaluation of patients at risk for COVID-19 are in a state of rapid change based on information released by regulatory bodies including the CDC and federal and state organizations. These policies and algorithms were followed during the patient's care in the ED.  Final Clinical Impression(s) / ED Diagnoses Final diagnoses:  COVID-19    Rx / DC Orders ED Discharge Orders    None       Zadie Rhine, MD 07/28/19 [phone]

## 2019-07-28 NOTE — Telephone Encounter (Signed)
Discussed with pt. Pt verbalized understanding. Inhaler sent to pharm. Pt states she will have someone pick her up an O2 sensor today. Will go back to hospital if drops below 94%. She also will call back when she is almost out of med if she needs something else.

## 2019-07-28 NOTE — Telephone Encounter (Signed)
Pt is having SOB. Pt had virtual yesterday and went to ER this morning. She has been sent home and wants to know if she needs an inhaler.   Also [facility] [place] didn't have the full amount of cough medicine and was only able to fill 60 mL of it. She would like a new script called in to another pharmacy.

## 2019-07-31 ENCOUNTER — Telehealth: Payer: Self-pay | Admitting: Family Medicine

## 2019-07-31 NOTE — Telephone Encounter (Signed)
Discussed with pt. Pt verbalized understanding. Pt also states her husband job is requiring her to be retested and wants to know when she should retest. States she never had a fever and her symptoms started 7 days ago. She also states people are telling her she needs a zpack and a steroid. She states she is getting better. Inhaler is helping with sob. Please advise

## 2019-07-31 NOTE — Telephone Encounter (Signed)
Remind pt tho I am not opposed to her taking these supplements, most studies show no help with the covid from these supplements, but if she wants to tak, and it will not harm her, take two vit d 's daily and two vit c's daily

## 2019-07-31 NOTE — Telephone Encounter (Signed)
Steroids in the outpatient covid sufferer are dangerous and can backfire by reducing our immune system response and increases likelihood of hospitlalization!  Zpk does zero for the virus, only useful with secondary sinus infections or bacterial complications  The cdc now strongly recommends no further testing in nonhospitalized folks to save resources for other people who cannot get tested, his workplace is using old info, we strongly do not recommend that

## 2019-07-31 NOTE — Telephone Encounter (Signed)
Discussed with pt. She verbalized understanding of all

## 2019-07-31 NOTE — Telephone Encounter (Signed)
Patient wants to know how much vitamin D and C to take due to her testing positive for Covid.

## 2019-07-31 NOTE — Telephone Encounter (Signed)
Pt states she is unsure of how to read the bottle. Pt states on the Vitamin D3 and instructions say take 2 gummies 50 mcg but  pt took 4 gummies. Vit C 500 mg one tablet pt took 2. Pt is just wanting to know how much should she be taking. Please advise. Thank you

## 2019-08-03 ENCOUNTER — Telehealth: Payer: Self-pay | Admitting: Family Medicine

## 2019-08-03 MED ORDER — HYDROCODONE-HOMATROPINE 5-1.5 MG/5ML PO SYRP: ORAL_SOLUTION | ORAL | 0 refills | Status: DC

## 2019-08-03 NOTE — Telephone Encounter (Signed)
Please sign. Pt does not need a call back. She was notified it would be sent this evening to pharm

## 2019-08-03 NOTE — Telephone Encounter (Signed)
I do not rx that cough med at all, husb can now get around her. Will not pass it back an forth. Immodium for diarrhea, advil for headaches. Not opposed to hycodan 2 ounces one tspn qhs prn cough

## 2019-08-03 NOTE — Telephone Encounter (Signed)
Patient is Covid positive and spoke with the nurse here on Friday.  She said she has a couple more questions. (Husband also tested positive)

## 2019-08-03 NOTE — Telephone Encounter (Signed)
Discussed with pt and she said she would like the hycodan and send to [place] [facility] since [place] is out. I pended med and it is ready to sign.

## 2019-08-03 NOTE — Telephone Encounter (Signed)
See message below about cough med. She states she was taking for headaches. Still having headaches and diarrhea. Her husband has now tested positive even though he has been in the basement. She wonders if he needs to stay in the basement since she has already has it. She states the heatlh dept told her she can come out of quarantine tomorrow but she does not feel up to going back to work yet and plans to go back on Sunday after her 14 days.

## 2019-08-03 NOTE — Telephone Encounter (Signed)
Pt calling to check status of earlier message   Pt also requesting refill on brompheniramine-pseudoephedrine-DM 30-2-10 MG/5ML syrup  States that [facility] don't have this & is wondering if something different can be called in  Also states husband has tested +Covid & she wonders if she can "get it again" from him  Please advise & call pt [phone]

## 2019-08-13 ENCOUNTER — Encounter: Payer: Self-pay | Admitting: Family Medicine

## 2019-08-13 ENCOUNTER — Ambulatory Visit (INDEPENDENT_AMBULATORY_CARE_PROVIDER_SITE_OTHER): Payer: BC Managed Care – PPO | Admitting: Family Medicine

## 2019-08-13 DIAGNOSIS — J329 Chronic sinusitis, unspecified: Secondary | ICD-10-CM

## 2019-08-13 DIAGNOSIS — U071 COVID-19: Secondary | ICD-10-CM

## 2019-08-13 DIAGNOSIS — J310 Chronic rhinitis: Secondary | ICD-10-CM

## 2019-08-13 MED ORDER — CEFDINIR 300 MG PO CAPS: ORAL_CAPSULE | ORAL | 0 refills | Status: DC

## 2019-08-13 NOTE — Progress Notes (Signed)
   Subjective:    Patient ID: Ashley Ray, female    DOB: 1971-05-05, 49 y.o.   MRN: [id number]  Sinusitis This is a recurrent problem. Episode onset: Jan 3 COVID positive. (Sinuses feel like they are on fire, headache, runny nose, cant sleep) Treatments tried: Flonase, Decongestant from [facility] pharmacy, steriod, z pack. The treatment provided no relief.   Virtual Visit via Telephone Note  I connected with Ashley Ray on 08/13/19 at  2:00 PM EST by telephone and verified that I am speaking with the correct person using two identifiers.  Location: Patient: home Provider: office   I discussed the limitations, risks, security and privacy concerns of performing an evaluation and management service by telephone and the availability of in person appointments. I also discussed with the patient that there may be a patient responsible charge related to this service. The patient expressed understanding and agreed to proceed.   History of Present Illness:    Observations/Objective:   Assessment and Plan:   Follow Up Instructions:    I discussed the assessment and treatment plan with the patient. The patient was provided an opportunity to ask questions and all were answered. The patient agreed with the plan and demonstrated an understanding of the instructions.   The patient was advised to call back or seek an in-person evaluation if the symptoms worsen or if the condition fails to improve as anticipated.  I provided 20 minutes of non-face-to-face time during this encounter.   Patient has ongoing substantial nasal congestion stuffiness which is very aggravating to the patient.  Her energy level is not back up to speed.  Occasional cough.  No shortness of breath.  No fevers    Review of Systems No vomiting no diarrhea no rash    Objective:   Physical Exam  Virtual      Assessment & Plan:  Pression COVID-19 persistent symptoms.  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  Warning signs discussed symptom care discussed

## 2019-08-26 ENCOUNTER — Encounter: Payer: Self-pay | Admitting: Family Medicine

## 2019-09-14 ENCOUNTER — Telehealth: Payer: Self-pay | Admitting: Family Medicine

## 2019-09-14 ENCOUNTER — Other Ambulatory Visit: Payer: Self-pay | Admitting: *Deleted

## 2019-09-14 MED ORDER — LEVOTHYROXINE SODIUM 50 MCG PO TABS: ORAL_TABLET | ORAL | 1 refills | Status: DC

## 2019-09-14 NOTE — Telephone Encounter (Signed)
Refill sent to pharm per dr Brett Canales. Left message to return call to notify pt

## 2019-09-14 NOTE — Telephone Encounter (Signed)
Pt.notified

## 2019-09-14 NOTE — Telephone Encounter (Signed)
EUTHYROX 50 MCG tablet   [facility]

## 2019-10-23 NOTE — Telephone Encounter (Signed)
Toniann Fail, can you sign this encounter to close it?  It won't let me and it is still in my inbasket.

## 2019-12-17 ENCOUNTER — Encounter: Payer: Self-pay | Admitting: Adult Health

## 2019-12-17 ENCOUNTER — Ambulatory Visit (INDEPENDENT_AMBULATORY_CARE_PROVIDER_SITE_OTHER): Payer: BC Managed Care – PPO | Admitting: Adult Health

## 2019-12-17 VITALS — BP 124/80 | HR 63 | Ht 66.0 in | Wt 170.0 lb

## 2019-12-17 DIAGNOSIS — N898 Other specified noninflammatory disorders of vagina: Secondary | ICD-10-CM

## 2019-12-17 DIAGNOSIS — R6882 Decreased libido: Secondary | ICD-10-CM | POA: Diagnosis not present

## 2019-12-17 DIAGNOSIS — Z79890 Hormone replacement therapy (postmenopausal): Secondary | ICD-10-CM

## 2019-12-17 DIAGNOSIS — N951 Menopausal and female climacteric states: Secondary | ICD-10-CM | POA: Diagnosis not present

## 2019-12-17 MED ORDER — EST ESTROGENS-METHYLTEST 0.625-1.25 MG PO TABS: 1.0000 | ORAL_TABLET | Freq: Every day | ORAL | 5 refills | Status: DC

## 2019-12-17 MED ORDER — PROGESTERONE 200 MG PO CAPS: ORAL_CAPSULE | ORAL | 6 refills | Status: DC

## 2019-12-17 NOTE — Progress Notes (Signed)
  Subjective:     Patient ID: Ashley Ray, female   DOB: 1971-07-19, 49 y.o.   MRN: [id number]  HPI Ashley Ray is a 49 year old white female, married, G1P1 in to discuss estrogen replacement. PCP is Dr Lubertha South   Review of Systems Has decreased libido, and slow to reach orgasm +vaginal dryness Not sleeping well Periods monthly, denies hot flashes or night sweats Feels hyper at times Reviewed past medical,surgical, social and family history. Reviewed medications and allergies.     Objective:   Physical Exam BP 124/80 (BP Location: Left Arm, Patient Position: Sitting, Cuff Size: Normal)   Pulse 63   Ht 5\' 6"  (1.676 m)   Wt 170 lb (77.1 kg)   LMP 11/29/2019 (Exact Date)   BMI 27.44 kg/m  Skin warm and dry. Neck: mid line trachea, normal thyroid, good ROM, no lymphadenopathy noted. Lungs: clear to ausculation bilaterally. Cardiovascular: regular rate and rhythm. Fall risk is low PHQ 9 score is 0.    Assessment:     1. Perimenopause Discussed symptoms   2. Decreased libido Will try estratest Increase frequency of sex  3. Vaginal dryness Try astro glide with sex, put on him too  4. Hormone replacement therapy (HRT) Discussed risks and benefits and she wants to try Will rx estra test and prometrium Meds ordered this encounter  Medications  . progesterone (PROMETRIUM) 200 MG capsule    Sig: Take 1 at bedtime    Dispense:  30 capsule    Refill:  6    Order Specific Question:   Supervising Provider    Answer:   01/29/2020, LUTHER H [[id number]]  . estrogen-methylTESTOSTERone 0.625-1.25 MG tablet    Sig: Take 1 tablet by mouth daily.    Dispense:  30 tablet    Refill:  5    Order Specific Question:   Supervising Provider    Answer:   12-05-1974 [[id number]]      Plan:     Review handout on menopause and HRT Return in July for pap and physical  If any pain in calves, or chest or any shortness of breath go to ER

## 2019-12-17 NOTE — Patient Instructions (Signed)
Menopause and Hormone Replacement Therapy Menopause is a normal time of life when menstrual periods stop completely and the ovaries stop producing the female hormones estrogen and progesterone. This lack of hormones can affect your health and cause undesirable symptoms. Hormone replacement therapy (HRT) can relieve some of those symptoms. What is hormone replacement therapy? HRT is the use of artificial (synthetic) hormones to replace hormones that your body has stopped producing because you have reached menopause. What are my options for HRT?  HRT may consist of the synthetic hormones estrogen and progestin, or it may consist of only estrogen (estrogen-only therapy). You and your health care provider will decide which form of HRT is best for you. If you choose to be on HRT and you have a uterus, estrogen and progestin are usually prescribed. Estrogen-only therapy is used for women who do not have a uterus. Possible options for taking HRT include:  Pills.  Patches.  Gels.  Sprays.  Vaginal cream.  Vaginal rings.  Vaginal inserts. The amount of hormone(s) that you take and how long you take the hormone(s) varies according to your health. It is important to:  Begin HRT with the lowest possible dosage.  Stop HRT as soon as your health care provider tells you to stop.  Work with your health care provider so that you feel informed and comfortable with your decisions. What are the benefits of HRT? HRT can reduce the frequency and severity of menopausal symptoms. Benefits of HRT vary according to the kind of symptoms that you have, how severe they are, and your overall health. HRT may help to improve the following symptoms of menopause:  Hot flashes and night sweats. These are sudden feelings of heat that spread over the face and body. The skin may turn red, like a blush. Night sweats are hot flashes that happen while you are sleeping or trying to sleep.  Bone loss (osteoporosis). The  body loses calcium more quickly after menopause, causing the bones to become weaker. This can increase the risk for bone breaks (fractures).  Vaginal dryness. The lining of the vagina can become thin and dry, which can cause pain during sex or cause infection, burning, or itching.  Urinary tract infections.  Urinary incontinence. This is the inability to control when you pass urine.  Irritability.  Short-term memory problems. What are the risks of HRT? Risks of HRT vary depending on your individual health and medical history. Risks of HRT also depend on whether you receive both estrogen and progestin or you receive estrogen only. HRT may increase the risk of:  Spotting. This is when a small amount of blood leaks from the vagina unexpectedly.  Endometrial cancer. This cancer is in the lining of the uterus (endometrium).  Breast cancer.  Increased density of breast tissue. This can make it harder to find breast cancer on a breast X-ray (mammogram).  Stroke.  Heart disease.  Blood clots.  Gallbladder disease.  Liver disease. Risks of HRT can increase if you have any of the following conditions:  Endometrial cancer.  Liver disease.  Heart disease.  Breast cancer.  History of blood clots.  History of stroke. Follow these instructions at home:  Take over-the-counter and prescription medicines only as told by your health care provider.  Get mammograms, pelvic exams, and medical checkups as often as told by your health care provider.  Have Pap tests done as often as told by your health care provider. A Pap test is sometimes called a Pap smear. It   is a screening test that is used to check for signs of cancer of the cervix and vagina. A Pap test can also identify the presence of infection or precancerous changes. Pap tests may be done: ? Every 3 years, starting at age 21. ? Every 5 years, starting after age 30, in combination with testing for human papillomavirus  (HPV). ? More often or less often depending on other medical conditions you have, your age, and other risk factors.  It is up to you to get the results of your Pap test. Ask your health care provider, or the department that is doing the test, when your results will be ready.  Keep all follow-up visits as told by your health care provider. This is important. Contact a health care provider if you have:  Pain or swelling in your legs.  Shortness of breath.  Chest pain.  Lumps or changes in your breasts or armpits.  Slurred speech.  Pain, burning, or bleeding when you urinate.  Unusual vaginal bleeding.  Dizziness or headaches.  Weakness or numbness in any part of your arms or legs.  Pain in your abdomen. Summary  Menopause is a normal time of life when menstrual periods stop completely and the ovaries stop producing the female hormones estrogen and progesterone.  Hormone replacement therapy (HRT) can relieve some of the symptoms of menopause.  HRT can reduce the frequency and severity of menopausal symptoms.  Risks of HRT vary depending on your individual health and medical history. This information is not intended to replace advice given to you by your health care provider. Make sure you discuss any questions you have with your health care provider. Document Revised: 03/11/2018 Document Reviewed: 03/11/2018 Elsevier Patient Education  2020 Elsevier Inc. Menopause Menopause is the normal time of life when menstrual periods stop completely. It is usually confirmed by 12 months without a menstrual period. The transition to menopause (perimenopause) most often happens between the ages of 45 and 55. During perimenopause, hormone levels change in your body, which can cause symptoms and affect your health. Menopause may increase your risk for:  Loss of bone (osteoporosis), which causes bone breaks (fractures).  Depression.  Hardening and narrowing of the arteries  (atherosclerosis), which can cause heart attacks and strokes. What are the causes? This condition is usually caused by a natural change in hormone levels that happens as you get older. The condition may also be caused by surgery to remove both ovaries (bilateral oophorectomy). What increases the risk? This condition is more likely to start at an earlier age if you have certain medical conditions or treatments, including:  A tumor of the pituitary gland in the brain.  A disease that affects the ovaries and hormone production.  Radiation treatment for cancer.  Certain cancer treatments, such as chemotherapy or hormone (anti-estrogen) therapy.  Heavy smoking and excessive alcohol use.  Family history of early menopause. This condition is also more likely to develop earlier in women who are very thin. What are the signs or symptoms? Symptoms of this condition include:  Hot flashes.  Irregular menstrual periods.  Night sweats.  Changes in feelings about sex. This could be a decrease in sex drive or an increased comfort around your sexuality.  Vaginal dryness and thinning of the vaginal walls. This may cause painful intercourse.  Dryness of the skin and development of wrinkles.  Headaches.  Problems sleeping (insomnia).  Mood swings or irritability.  Memory problems.  Weight gain.  Hair growth on the face   and chest.  Bladder infections or problems with urinating. How is this diagnosed? This condition is diagnosed based on your medical history, a physical exam, your age, your menstrual history, and your symptoms. Hormone tests may also be done. How is this treated? In some cases, no treatment is needed. You and your health care provider should make a decision together about whether treatment is necessary. Treatment will be based on your individual condition and preferences. Treatment for this condition focuses on managing symptoms. Treatment may include:  Menopausal  hormone therapy (MHT).  Medicines to treat specific symptoms or complications.  Acupuncture.  Vitamin or herbal supplements. Before starting treatment, make sure to let your health care provider know if you have a personal or family history of:  Heart disease.  Breast cancer.  Blood clots.  Diabetes.  Osteoporosis. Follow these instructions at home: Lifestyle  Do not use any products that contain nicotine or tobacco, such as cigarettes and e-cigarettes. If you need help quitting, ask your health care provider.  Get at least 30 minutes of physical activity on 5 or more days each week.  Avoid alcoholic and caffeinated beverages, as well as spicy foods. This may help prevent hot flashes.  Get 7-8 hours of sleep each night.  If you have hot flashes, try: ? Dressing in layers. ? Avoiding things that may trigger hot flashes, such as spicy food, warm places, or stress. ? Taking slow, deep breaths when a hot flash starts. ? Keeping a fan in your home and office.  Find ways to manage stress, such as deep breathing, meditation, or journaling.  Consider going to group therapy with other women who are having menopause symptoms. Ask your health care provider about recommended group therapy meetings. Eating and drinking  Eat a healthy, balanced diet that contains whole grains, lean protein, low-fat dairy, and plenty of fruits and vegetables.  Your health care provider may recommend adding more soy to your diet. Foods that contain soy include tofu, tempeh, and soy milk.  Eat plenty of foods that contain calcium and vitamin D for bone health. Items that are rich in calcium include low-fat milk, yogurt, beans, almonds, sardines, broccoli, and kale. Medicines  Take over-the-counter and prescription medicines only as told by your health care provider.  Talk with your health care provider before starting any herbal supplements. If prescribed, take vitamins and supplements as told by your  health care provider. These may include: ? Calcium. Women age 51 and older should get 1,200 mg (milligrams) of calcium every day. ? Vitamin D. Women need 600-800 International Units of vitamin D each day. ? Vitamins B12 and B6. Aim for 50 micrograms of B12 and 1.5 mg of B6 each day. General instructions  Keep track of your menstrual periods, including: ? When they occur. ? How heavy they are and how long they last. ? How much time passes between periods.  Keep track of your symptoms, noting when they start, how often you have them, and how long they last.  Use vaginal lubricants or moisturizers to help with vaginal dryness and improve comfort during sex.  Keep all follow-up visits as told by your health care provider. This is important. This includes any group therapy or counseling. Contact a health care provider if:  You are still having menstrual periods after age 55.  You have pain during sex.  You have not had a period for 12 months and you develop vaginal bleeding. Get help right away if:  You have: ?   Severe depression. ? Excessive vaginal bleeding. ? Pain when you urinate. ? A fast or irregular heart beat (palpitations). ? Severe headaches. ? Abdomen (abdominal) pain or severe indigestion.  You fell and you think you have a broken bone.  You develop leg or chest pain.  You develop vision problems.  You feel a lump in your breast. Summary  Menopause is the normal time of life when menstrual periods stop completely. It is usually confirmed by 12 months without a menstrual period.  The transition to menopause (perimenopause) most often happens between the ages of 45 and 55.  Symptoms can be managed through medicines, lifestyle changes, and complementary therapies such as acupuncture.  Eat a balanced diet that is rich in nutrients to promote bone health and heart health and to manage symptoms during menopause. This information is not intended to replace advice given  to you by your health care provider. Make sure you discuss any questions you have with your health care provider. Document Revised: 06/21/2017 Document Reviewed: 08/11/2016 Elsevier Patient Education  2020 Elsevier Inc.  

## 2020-02-05 ENCOUNTER — Telehealth: Payer: Self-pay | Admitting: Adult Health

## 2020-02-05 MED ORDER — FLUCONAZOLE 150 MG PO TABS
ORAL_TABLET | ORAL | 1 refills | Status: DC
Start: 2020-02-05 — End: 2020-07-19

## 2020-02-05 NOTE — Telephone Encounter (Signed)
Will rx diflucan  

## 2020-02-05 NOTE — Telephone Encounter (Signed)
Needs to talk to [facility] about calling in some script for a yeast infection

## 2020-02-05 NOTE — Telephone Encounter (Addendum)
Pt has an appt on Monday for yearly. Pt is having vaginal itching; used a different soap and wonders if it has caused a yeast infection. Pt has noticed vaginal dryness as well. Pt is requesting something for yeast. Please advise. Thanks!! JSY

## 2020-02-05 NOTE — Addendum Note (Signed)
Addended by: Cyril Mourning A on: 02/05/2020 12:55 PM   Modules accepted: Orders

## 2020-02-08 ENCOUNTER — Other Ambulatory Visit: Payer: Self-pay

## 2020-02-08 ENCOUNTER — Encounter: Payer: Self-pay | Admitting: Adult Health

## 2020-02-08 ENCOUNTER — Other Ambulatory Visit (HOSPITAL_COMMUNITY)
Admission: RE | Admit: 2020-02-08 | Discharge: 2020-02-08 | Disposition: A | Discharge status: Home / Self Care | Payer: BC Managed Care – PPO | Source: Ambulatory Visit | Attending: Adult Health | Admitting: Adult Health

## 2020-02-08 ENCOUNTER — Ambulatory Visit (INDEPENDENT_AMBULATORY_CARE_PROVIDER_SITE_OTHER): Payer: BC Managed Care – PPO | Admitting: Adult Health

## 2020-02-08 VITALS — BP 108/64 | HR 55 | Ht 66.0 in | Wt 173.0 lb

## 2020-02-08 DIAGNOSIS — Z1211 Encounter for screening for malignant neoplasm of colon: Secondary | ICD-10-CM | POA: Insufficient documentation

## 2020-02-08 DIAGNOSIS — R87810 Cervical high risk human papillomavirus (HPV) DNA test positive: Secondary | ICD-10-CM | POA: Insufficient documentation

## 2020-02-08 DIAGNOSIS — Z01419 Encounter for gynecological examination (general) (routine) without abnormal findings: Secondary | ICD-10-CM | POA: Insufficient documentation

## 2020-02-08 DIAGNOSIS — Z79890 Hormone replacement therapy (postmenopausal): Secondary | ICD-10-CM | POA: Diagnosis not present

## 2020-02-08 DIAGNOSIS — N951 Menopausal and female climacteric states: Secondary | ICD-10-CM

## 2020-02-08 DIAGNOSIS — L292 Pruritus vulvae: Secondary | ICD-10-CM

## 2020-02-08 LAB — HEMOCCULT GUIAC POC 1CARD (OFFICE): Fecal Occult Blood, POC: NEGATIVE

## 2020-02-08 MED ORDER — NYSTATIN-TRIAMCINOLONE 100000-0.1 UNIT/GM-% EX CREA: 1.0000 "application " | TOPICAL_CREAM | Freq: Two times a day (BID) | CUTANEOUS | 1 refills | Status: DC

## 2020-02-08 NOTE — Progress Notes (Signed)
Patient ID: Ashley Ray, female   DOB: November 19, 1970, 49 y.o.   MRN: [id number] History of Present Illness: Ashley Ray is a 49 year old white female, married, G1P1, in for well woman gyn exam and pap. PCP is Dr Gerda Diss.    Current Medications, Allergies, Past Medical History, Past Surgical History, Family History and Social History were reviewed in [employer] record.     Review of Systems: Patient denies any headaches, hearing loss, fatigue, blurred vision, shortness of breath, chest pain, abdominal pain, problems with bowel movements, urination, or intercourse. No joint pain or mood swings. Has some breast tenderness and vulva itch, has used diflucan.  Sleeping is better, still feels dry, libido not great   Physical Exam:BP 108/64 (BP Location: Left Arm, Patient Position: Sitting, Cuff Size: Normal)   Pulse (!) 55   Ht 5\' 6"  (1.676 m)   Wt 173 lb (78.5 kg)   LMP 01/19/2020   BMI 27.92 kg/m  General:  Well developed, well nourished, no acute distress Skin:  Warm and dry Neck:  Midline trachea, normal thyroid, good ROM, no lymphadenopathy Lungs; Clear to auscultation bilaterally Breast:  No dominant palpable mass, retraction, or nipple discharge Cardiovascular: Regular rate and rhythm Abdomen:  Soft, non tender, no hepatosplenomegaly Pelvic:  External genitalia is normal in appearance, no lesions.  The vagina is normal in appearance. Urethra has no lesions or masses. The cervix is bulbous.Pap with high risk HPV 16/18 genotyping performed.  Uterus is felt to be normal size, shape, and contour.  No adnexal masses or tenderness noted.Bladder is non tender, no masses felt. Rectal: Good sphincter tone, no polyps, or hemorrhoids felt.  Hemoccult negative. Extremities/musculoskeletal:  No swelling or varicosities noted, no clubbing or cyanosis Psych:  No mood changes, alert and cooperative,seems happy AA is 2 Fall risk is low PHQ 9 score is 0  [facility] - 02/08/20  0946      Pregnancy Intention Screening   Does the patient want to become pregnant in the next year? No    Does the patient's partner want to become pregnant in the next year? No    Would the patient like to discuss contraceptive options today? No      Contraception Wrap Up   Current Method Vasectomy    End Method Vasectomy    Contraception Counseling Provided No         Examination chaperoned by 02/10/20 LPN  Impression and Plan: 1. Encounter for gynecological examination with Papanicolaou smear of cervix Pap sent Physical in 1 year Pt wants pap yearly Mammogram [place] Will rx mycolog for vulva itching  Meds ordered this encounter  Medications  . nystatin-triamcinolone (MYCOLOG II) cream    Sig: Apply 1 application topically 2 (two) times daily.    Dispense:  30 g    Refill:  1    Order Specific Question:   Supervising Provider    Answer:   Faith Rogue, LUTHER H [[id number]]    2. Encounter for screening fecal occult blood testing Colonoscopy at 50  3. Perimenopause   4. Hormone replacement therapy (HRT) Continue meds, has refills   5. Papanicolaou smear of cervix with positive high risk human papilloma virus (HPV) test Pap sent with HPV testing

## 2020-02-10 ENCOUNTER — Telehealth: Payer: Self-pay | Admitting: Adult Health

## 2020-02-10 LAB — CYTOLOGY - PAP
Comment: NEGATIVE
Diagnosis: NEGATIVE
High risk HPV: NEGATIVE

## 2020-02-10 MED ORDER — NYSTATIN 100000 UNIT/GM EX CREA: 1.0000 "application " | TOPICAL_CREAM | Freq: Two times a day (BID) | CUTANEOUS | 1 refills | Status: DC

## 2020-02-10 MED ORDER — TRIAMCINOLONE ACETONIDE 0.5 % EX CREA: 1.0000 "application " | TOPICAL_CREAM | Freq: Two times a day (BID) | CUTANEOUS | 1 refills | Status: DC

## 2020-02-10 NOTE — Telephone Encounter (Signed)
Will send in rx for nystatin and triamcinolone cream, just mix together

## 2020-02-10 NOTE — Telephone Encounter (Signed)
The medicine that was prescribed to the pt, was not covered by her insurance and [facility] stated they sent [place] a fax about the prescription, to see if she can get a different prescription.  Patient wanted to know if that fax was received.

## 2020-02-10 NOTE — Telephone Encounter (Signed)
Pt states [facility] said they don't cover her prescription cream that was sent in. She needs it to be sent in as two separate creams.

## 2020-02-15 ENCOUNTER — Telehealth: Payer: Self-pay | Admitting: Adult Health

## 2020-02-15 MED ORDER — ESTRADIOL 1 MG PO TABS
1.0000 mg | ORAL_TABLET | Freq: Every day | ORAL | 11 refills | Status: DC
Start: 2020-02-15 — End: 2020-07-19

## 2020-02-15 NOTE — Telephone Encounter (Signed)
Patient called in about her prescription. Her insurance will not cover for her estrogen, the morning pill.  She wants something that her insurance will cover, and a call back.

## 2020-02-15 NOTE — Telephone Encounter (Signed)
Will D/C estra test and rx estrace 1 mg

## 2020-03-08 ENCOUNTER — Telehealth: Payer: Self-pay | Admitting: *Deleted

## 2020-03-08 ENCOUNTER — Telehealth: Payer: Self-pay | Admitting: Adult Health

## 2020-03-08 NOTE — Telephone Encounter (Signed)
Pt will talk with pharmacist

## 2020-03-08 NOTE — Telephone Encounter (Signed)
Patient states she has spoken with her insurance regarding the medication [place] sent in and has a list of medications that is covered.  Requested patient send list in mychart if possible.

## 2020-03-08 NOTE — Telephone Encounter (Signed)
Left message to call me about meds

## 2020-03-16 ENCOUNTER — Telehealth: Payer: Self-pay | Admitting: Adult Health

## 2020-03-16 NOTE — Telephone Encounter (Signed)
Telephoned patient and advised per Adline Potter, NP weight gain is not a symptom of the medication. It is more related to menopause. Patient voiced understanding.

## 2020-03-16 NOTE — Telephone Encounter (Signed)
Pt states that she is gaining weight and would like to know if it is related to a medication reaction. Patient states the medication is for hormone replacement did not have the name of the medicine. Patient would like nurse to follow up regarding symptoms.

## 2020-04-11 ENCOUNTER — Telehealth: Payer: Self-pay | Admitting: Family Medicine

## 2020-04-11 ENCOUNTER — Other Ambulatory Visit: Payer: Self-pay | Admitting: *Deleted

## 2020-04-11 MED ORDER — LEVOTHYROXINE SODIUM 50 MCG PO TABS: ORAL_TABLET | ORAL | 0 refills | Status: DC

## 2020-04-11 NOTE — Telephone Encounter (Signed)
Med sent to pharm 

## 2020-04-11 NOTE — Telephone Encounter (Signed)
Pt notified.

## 2020-04-11 NOTE — Telephone Encounter (Signed)
Pt made Med follow up on 10/14 needs refill on levothyroxine (EUTHYROX) 50 MCG tablet  [facility]   Pt call back [phone]

## 2020-05-05 ENCOUNTER — Ambulatory Visit: Payer: BC Managed Care – PPO | Admitting: Family Medicine

## 2020-05-10 ENCOUNTER — Ambulatory Visit: Payer: BC Managed Care – PPO | Admitting: Adult Health

## 2020-05-27 ENCOUNTER — Telehealth: Payer: Self-pay

## 2020-05-27 NOTE — Telephone Encounter (Signed)
Pt wants to stop taking the estrogen an another medication that she takes at night and wants to know if she should stop taking it due to irregular periods she is having since she started taking the medication.

## 2020-05-27 NOTE — Telephone Encounter (Signed)
Pt wants to know if she can stop the estrogen because it has changed her periods. They are very irregular since starting it. She is having two periods per month sometimes. Not sure if its the meds or just that she is perimenopausal.  She has a visit with Victorino Dike on 11/17 but wanted to see if another provider would advise her.

## 2020-05-30 NOTE — Telephone Encounter (Signed)
Keep the appointment and continue the meds as ordered for now

## 2020-05-30 NOTE — Telephone Encounter (Signed)
Pt informed of Dr. Forestine Chute recommendations.

## 2020-05-31 ENCOUNTER — Ambulatory Visit: Payer: Self-pay | Admitting: Family Medicine

## 2020-06-08 ENCOUNTER — Ambulatory Visit: Payer: BC Managed Care – PPO | Admitting: Adult Health

## 2020-06-13 ENCOUNTER — Ambulatory Visit: Payer: Self-pay | Admitting: Family Medicine

## 2020-07-08 ENCOUNTER — Telehealth: Payer: Self-pay

## 2020-07-08 MED ORDER — LEVOTHYROXINE SODIUM 50 MCG PO TABS: ORAL_TABLET | ORAL | 0 refills | Status: DC

## 2020-07-08 NOTE — Telephone Encounter (Signed)
Pt appt was cancelled Dr was out of office we made New Med Check appt on 12/28 pt need refill on levothyroxine (EUTHYROX) 50 MCG tablet   Pt call back [phone]

## 2020-07-19 ENCOUNTER — Ambulatory Visit: Payer: BC Managed Care – PPO | Admitting: Adult Health

## 2020-07-19 ENCOUNTER — Encounter: Payer: Self-pay | Admitting: Adult Health

## 2020-07-19 ENCOUNTER — Other Ambulatory Visit: Payer: Self-pay

## 2020-07-19 ENCOUNTER — Encounter: Payer: Self-pay | Admitting: Family Medicine

## 2020-07-19 ENCOUNTER — Ambulatory Visit: Payer: BC Managed Care – PPO | Admitting: Family Medicine

## 2020-07-19 ENCOUNTER — Ambulatory Visit (INDEPENDENT_AMBULATORY_CARE_PROVIDER_SITE_OTHER): Payer: BC Managed Care – PPO | Admitting: Adult Health

## 2020-07-19 VITALS — BP 107/67 | HR 73 | Ht 66.0 in | Wt 180.0 lb

## 2020-07-19 VITALS — BP 126/84 | HR 91 | Temp 98.1°F | Wt 180.0 lb

## 2020-07-19 DIAGNOSIS — Z9889 Other specified postprocedural states: Secondary | ICD-10-CM | POA: Diagnosis not present

## 2020-07-19 DIAGNOSIS — N926 Irregular menstruation, unspecified: Secondary | ICD-10-CM | POA: Insufficient documentation

## 2020-07-19 DIAGNOSIS — R609 Edema, unspecified: Secondary | ICD-10-CM

## 2020-07-19 DIAGNOSIS — J0110 Acute frontal sinusitis, unspecified: Secondary | ICD-10-CM | POA: Diagnosis not present

## 2020-07-19 DIAGNOSIS — Z79890 Hormone replacement therapy (postmenopausal): Secondary | ICD-10-CM | POA: Diagnosis not present

## 2020-07-19 DIAGNOSIS — Z0000 Encounter for general adult medical examination without abnormal findings: Secondary | ICD-10-CM | POA: Diagnosis not present

## 2020-07-19 DIAGNOSIS — Z1321 Encounter for screening for nutritional disorder: Secondary | ICD-10-CM | POA: Diagnosis not present

## 2020-07-19 MED ORDER — AMOXICILLIN 500 MG PO CAPS: 500.0000 mg | ORAL_CAPSULE | Freq: Three times a day (TID) | ORAL | 0 refills | Status: DC

## 2020-07-19 MED ORDER — HYDROCHLOROTHIAZIDE 12.5 MG PO TABS: 12.5000 mg | ORAL_TABLET | Freq: Every day | ORAL | 0 refills | Status: DC

## 2020-07-19 MED ORDER — PROGESTERONE 200 MG PO CAPS: ORAL_CAPSULE | ORAL | 3 refills | Status: DC

## 2020-07-19 NOTE — Progress Notes (Signed)
Patient ID: Ashley Ray, female    DOB: 1970-09-03, 49 y.o.   MRN: [id number]   Chief Complaint  Patient presents with  . Hypothyroidism  . Follow-up    Patient reports swelling in hands, legs and face along with headaches and fatigue for the past couple of weeks.  Would like to have blood work done today.    Subjective:    HPI Pt was seen to establish care.  Pt with history of hypothyroidism, gastric bypass and worsening edema.  Swelling in hands and face.  Pt stating feeling her pressures are up today for her. Having headaches, pt stating she "always has headaches." at this time also had issue with runny nose for weeks and changed from claritin to zyrtec.  This is helping with the drainage but now having headaches and  then waking up with eyelids lower, swollen.  Had weight loss surgery 5-6 yrs ago.  Now starting to come back.  Last year bp running- 118/68, 110/80. 122/70, 110/70.  Has had 10-11 lb weight gain in the past year, per the chart.  Not drinking as much water and drank 64 oz.  Pt feeling like she is "Gaining about 10 lbs in last few wks", and hasn't had best diet. But states her weight is usually around 172 lbs.  Has h/o gastric bypass in past. Used to take medication for fluid retention over 20 yrs ago. Pt stating always "having large legs and swelling."  Had covid in 1/21.  Headaches- taking sinus medication for this. Resolved and then comes back. Pressure behind eyes.  Last few weeks. Has episodic headaches over the years, that come and go.  Medical History Joeline has a past medical history of Abnormal Pap smear of cervix, History of gastric surgery (07/25/2018), History of trichomoniasis (12/10/2016), Hypothyroid (02/08/2015), Pain in shoulder region (07/05/2015), Thyroid disease, Trichimoniasis, and Vulvar abscess (07/05/2015).   Outpatient Encounter Medications as of 07/19/2020  Medication Sig  . amoxicillin (AMOXIL) 500 MG capsule Take 1  capsule (500 mg total) by mouth 3 (three) times daily. (Patient not taking: Reported on 07/19/2020)  . hydrochlorothiazide (HYDRODIURIL) 12.5 MG tablet Take 1 tablet (12.5 mg total) by mouth daily. (Patient not taking: Reported on 07/19/2020)  . Biotin 1 MG CAPS Take by mouth.  . fexofenadine (ALLEGRA) 180 MG tablet Take 180 mg by mouth daily.  Marland Kitchen levothyroxine (EUTHYROX) 50 MCG tablet TAKE 1 TABLET BY MOUTH ONCE DAILY BEFORE BREAKFAST  . nystatin cream (MYCOSTATIN) Apply 1 application topically 2 (two) times daily.  Marland Kitchen triamcinolone cream (KENALOG) 0.5 % Apply 1 application topically 2 (two) times daily.  . [DISCONTINUED] CALCIUM PO Take by mouth 2 (two) times daily.  . [DISCONTINUED] estradiol (ESTRACE) 1 MG tablet Take 1 tablet (1 mg total) by mouth daily.  . [DISCONTINUED] fluconazole (DIFLUCAN) 150 MG tablet Take 1 now and 1 in 3 days if needed  . [DISCONTINUED] Multiple Vitamin (MULTIVITAMIN) tablet Take 1 tablet by mouth 4 (four) times daily.  . [DISCONTINUED] progesterone (PROMETRIUM) 200 MG capsule Take 1 at bedtime   No facility-administered encounter medications on file as of 07/19/2020.     Review of Systems  Constitutional: Positive for fatigue. Negative for activity change, appetite change, chills and fever.  HENT: Positive for congestion, sinus pressure and sinus pain. Negative for ear pain, rhinorrhea and sore throat.   Respiratory: Negative for cough, shortness of breath and wheezing.   Cardiovascular: Negative for chest pain and leg swelling.       Lower  leg swelling, chronic, hand swelling, and under eye swelling/puffiness.  Gastrointestinal: Negative for abdominal pain, diarrhea, nausea and vomiting.  Genitourinary: Negative for dysuria and frequency.  Musculoskeletal: Negative for arthralgias and back pain.  Skin: Negative for rash.  Neurological: Positive for headaches. Negative for dizziness and weakness.     Vitals BP 126/84   Pulse 91   Temp 98.1 F (36.7 C)    Wt 180 lb (81.6 kg)   LMP 07/07/2020   SpO2 99%   BMI 29.05 kg/m   Objective:   Physical Exam Vitals and nursing note reviewed.  Constitutional:      General: She is not in acute distress.    Appearance: Normal appearance. She is not ill-appearing.  HENT:     Head: Normocephalic and atraumatic.     Right Ear: Tympanic membrane, ear canal and external ear normal.     Left Ear: Tympanic membrane, ear canal and external ear normal.     Nose: Nose normal. No congestion.     Mouth/Throat:     Mouth: Mucous membranes are moist.     Pharynx: Oropharynx is clear. No oropharyngeal exudate or posterior oropharyngeal erythema.  Eyes:     Extraocular Movements: Extraocular movements intact.     Conjunctiva/sclera: Conjunctivae normal.     Pupils: Pupils are equal, round, and reactive to light.     Comments: +puffiness under bilateral eyes.  Cardiovascular:     Rate and Rhythm: Normal rate and regular rhythm.     Pulses: Normal pulses.     Heart sounds: Normal heart sounds. No murmur heard.   Pulmonary:     Effort: Pulmonary effort is normal. No respiratory distress.     Breath sounds: Normal breath sounds. No wheezing, rhonchi or rales.  Musculoskeletal:        General: Normal range of motion.     Right lower leg: No edema.     Left lower leg: No edema.     Comments: +bilateral leg swelling, non pitting.   Skin:    General: Skin is warm and dry.     Findings: No lesion or rash.  Neurological:     General: No focal deficit present.     Mental Status: She is alert and oriented to person, place, and time.     Cranial Nerves: No cranial nerve deficit.  Psychiatric:        Mood and Affect: Mood normal.        Behavior: Behavior normal.        Thought Content: Thought content normal.        Judgment: Judgment normal.     Assessment and Plan   1. Acute non-recurrent frontal sinusitis - amoxicillin (AMOXIL) 500 MG capsule; Take 1 capsule (500 mg total) by mouth 3 (three) times  daily. (Patient not taking: Reported on 07/19/2020)  Dispense: 30 capsule; Refill: 0  2. Laboratory tests ordered as part of a complete physical exam (CPE) - B12 - CBC - CMP14+EGFR - Lipid panel - TSH - Vitamin D (25 hydroxy)  3. Encounter for vitamin deficiency screening - B12 - Vitamin D (25 hydroxy)  4. History of gastric surgery - B12 - CMP14+EGFR - Vitamin D (25 hydroxy)  5. Peripheral edema - hydrochlorothiazide (HYDRODIURIL) 12.5 MG tablet; Take 1 tablet (12.5 mg total) by mouth daily. (Patient not taking: Reported on 07/19/2020)  Dispense: 30 tablet; Refill: 0    Sinusitis- will treat with amoxicillin. Cont with sudafed and tylenol prn.  For edema- start  hctz.  Decrease salt intake.  H/o gastric bypass- has h/o low vit d and b12 in past. Not currently taking any vitamins.  Hypothyroidism- will recheck labs. Cont levothyroxine. Pt to get labs today for her physical.   F/u 3 wks for physical and review labs or prn.

## 2020-07-19 NOTE — Progress Notes (Signed)
  Subjective:     Patient ID: Ashley Ray, female   DOB: 30-Sep-1970, 49 y.o.   MRN: [id number]  HPI Pia is a 49 year old white female,married, in complaining of irregular bleeding with estrace and Prometrium. PCP is Dr Ladona Ridgel  Review of Systems +irregular bleeding Denies any hot flashes, or night sweats Reviewed past medical,surgical, social and family history. Reviewed medications and allergies.     Objective:   Physical Exam BP 107/67 (BP Location: Left Arm, Patient Position: Sitting, Cuff Size: Normal)   Pulse 73   Ht 5\' 6"  (1.676 m)   Wt 180 lb (81.6 kg)   LMP 07/07/2020   BMI 29.05 kg/m  Skin warm and dry. Lungs: clear to ausculation bilaterally. Cardiovascular: regular rate and rhythm.     Upstream - 07/19/20 1209      Pregnancy Intention Screening   Does the patient want to become pregnant in the next year? No    Does the patient's partner want to become pregnant in the next year? No    Would the patient like to discuss contraceptive options today? No      Contraception Wrap Up   Current Method Vasectomy    End Method Vasectomy    Contraception Counseling Provided No          Assessment:     1. Hormone replacement therapy (HRT) Continue Prometrium Meds ordered this encounter  Medications  . progesterone (PROMETRIUM) 200 MG capsule    Sig: Take 1 at bedtime    Dispense:  30 capsule    Refill:  3    Order Specific Question:   Supervising Provider    Answer:   1210 H [[id number]]  -stop estrace   2. Irregular periods Stop estrace but continue Prometrium     Plan:     Follow up with me in 6 weeks

## 2020-07-20 LAB — LIPID PANEL
Chol/HDL Ratio: 2 ratio (ref 0.0–4.4)
Cholesterol, Total: 200 mg/dL — ABNORMAL HIGH (ref 100–199)
HDL: 99 mg/dL (ref >39)
LDL Chol Calc (NIH): 86 mg/dL (ref 0–99)
Triglycerides: 84 mg/dL (ref 0–149)
VLDL Cholesterol Cal: 15 mg/dL (ref 5–40)

## 2020-07-20 LAB — CMP14+EGFR
ALT: 16 IU/L (ref 0–32)
AST: 17 IU/L (ref 0–40)
Albumin/Globulin Ratio: 2.1 (ref 1.2–2.2)
Albumin: 4.4 g/dL (ref 3.8–4.8)
Alkaline Phosphatase: 54 IU/L (ref 44–121)
BUN/Creatinine Ratio: 25 — ABNORMAL HIGH (ref 9–23)
BUN: 19 mg/dL (ref 6–24)
Bilirubin Total: 0.5 mg/dL (ref 0.0–1.2)
CO2: 23 mmol/L (ref 20–29)
Calcium: 9.5 mg/dL (ref 8.7–10.2)
Chloride: 101 mmol/L (ref 96–106)
Creatinine, Ser: 0.77 mg/dL (ref 0.57–1.00)
GFR calc Af Amer: 105 mL/min/{1.73_m2} (ref >59)
GFR calc non Af Amer: 91 mL/min/{1.73_m2} (ref >59)
Globulin, Total: 2.1 g/dL (ref 1.5–4.5)
Glucose: 88 mg/dL (ref 65–99)
Potassium: 4.3 mmol/L (ref 3.5–5.2)
Sodium: 136 mmol/L (ref 134–144)
Total Protein: 6.5 g/dL (ref 6.0–8.5)

## 2020-07-20 LAB — CBC
Hematocrit: 38.1 % (ref 34.0–46.6)
Hemoglobin: 12.7 g/dL (ref 11.1–15.9)
MCH: 29.6 pg (ref 26.6–33.0)
MCHC: 33.3 g/dL (ref 31.5–35.7)
MCV: 89 fL (ref 79–97)
Platelets: 202 10*3/uL (ref 150–450)
RBC: 4.29 x10E6/uL (ref 3.77–5.28)
RDW: 12 % (ref 11.7–15.4)
WBC: 7 10*3/uL (ref 3.4–10.8)

## 2020-07-20 LAB — TSH: TSH: 0.944 u[IU]/mL (ref 0.450–4.500)

## 2020-07-20 LAB — VITAMIN D 25 HYDROXY (VIT D DEFICIENCY, FRACTURES): Vit D, 25-Hydroxy: 32.2 ng/mL (ref 30.0–100.0)

## 2020-07-20 LAB — VITAMIN B12: Vitamin B-12: 517 pg/mL (ref 232–1245)

## 2020-07-25 ENCOUNTER — Telehealth: Payer: Self-pay | Admitting: Adult Health

## 2020-07-25 MED ORDER — FLUCONAZOLE 150 MG PO TABS: ORAL_TABLET | ORAL | 1 refills | Status: DC

## 2020-07-25 NOTE — Telephone Encounter (Signed)
Will rx diflucan to [facility] in [place]

## 2020-07-25 NOTE — Telephone Encounter (Signed)
Pt called requesting Rx for yeast infection, states her PCP gave her some antibiotics & now she's got a yeast infection  Please advise & notify pt    [facility]

## 2020-08-03 ENCOUNTER — Encounter: Payer: BC Managed Care – PPO | Admitting: Family Medicine

## 2020-08-30 ENCOUNTER — Ambulatory Visit: Payer: BC Managed Care – PPO | Admitting: Adult Health

## 2020-09-14 ENCOUNTER — Ambulatory Visit (INDEPENDENT_AMBULATORY_CARE_PROVIDER_SITE_OTHER): Payer: BC Managed Care – PPO | Admitting: Adult Health

## 2020-09-14 ENCOUNTER — Other Ambulatory Visit: Payer: Self-pay

## 2020-09-14 ENCOUNTER — Encounter: Payer: Self-pay | Admitting: Adult Health

## 2020-09-14 VITALS — BP 122/74 | HR 62 | Ht 66.0 in | Wt 181.0 lb

## 2020-09-14 DIAGNOSIS — Z79890 Hormone replacement therapy (postmenopausal): Secondary | ICD-10-CM

## 2020-09-14 MED ORDER — PROGESTERONE 200 MG PO CAPS: ORAL_CAPSULE | ORAL | 6 refills | Status: DC

## 2020-09-14 MED ORDER — FLUCONAZOLE 150 MG PO TABS: ORAL_TABLET | ORAL | 1 refills | Status: DC

## 2020-09-14 NOTE — Progress Notes (Signed)
  Subjective:     Patient ID: Ashley Ray, female   DOB: Jan 15, 1971, 50 y.o.   MRN: [id number]  HPI Ashley Ray is a 50 year old white female, married, G1P1 back in follow up on periods after stopping estrace and periods regular, requests refill on diflucan, gets yeast if "fools around'. Last pap was 02/08/20 negative for malignancy and HPV.  PCP is Dr Ladona Ridgel.   Review of Systems Periods regular since stopping estrace Gets yeast if "fools around" Reviewed past medical,surgical, social and family history. Reviewed medications and allergies.     Objective:   Physical Exam BP 122/74 (BP Location: Right Arm, Patient Position: Sitting, Cuff Size: Normal)   Pulse 62   Ht 5\' 6"  (1.676 m)   Wt 181 lb (82.1 kg)   LMP 09/05/2020   BMI 29.21 kg/m  Skin warm and dry.  Lungs: clear to ausculation bilaterally. Cardiovascular: regular rate and rhythm. Fall risk is low  Upstream - 09/14/20 1607      Pregnancy Intention Screening   Does the patient want to become pregnant in the next year? No    Does the patient's partner want to become pregnant in the next year? No    Would the patient like to discuss contraceptive options today? No      Contraception Wrap Up   Current Method Vasectomy    End Method Vasectomy    Contraception Counseling Provided No             Assessment:     1. Hormone replacement therapy (HRT) Continue prometrium Meds ordered this encounter  Medications  . progesterone (PROMETRIUM) 200 MG capsule    Sig: Take 1 at bedtime    Dispense:  30 capsule    Refill:  6    Order Specific Question:   Supervising Provider    Answer:   09/16/20, LUTHER H [[id number]]  . fluconazole (DIFLUCAN) 150 MG tablet    Sig: Take 1 now and 1 in 3 days    Dispense:  2 tablet    Refill:  1    Order Specific Question:   Supervising Provider    Answer:   Despina Hidden [[id number]]      Plan:     Follow up about 02/13/21 for pap and physical

## 2020-10-04 ENCOUNTER — Encounter: Payer: BC Managed Care – PPO | Admitting: Family Medicine

## 2020-10-17 ENCOUNTER — Encounter: Payer: Self-pay | Admitting: Family Medicine

## 2020-10-17 ENCOUNTER — Ambulatory Visit (INDEPENDENT_AMBULATORY_CARE_PROVIDER_SITE_OTHER): Payer: BC Managed Care – PPO | Admitting: Family Medicine

## 2020-10-17 ENCOUNTER — Other Ambulatory Visit: Payer: Self-pay

## 2020-10-17 VITALS — BP 102/68 | HR 51 | Temp 98.2°F | Ht 66.0 in | Wt 175.0 lb

## 2020-10-17 DIAGNOSIS — B001 Herpesviral vesicular dermatitis: Secondary | ICD-10-CM | POA: Diagnosis not present

## 2020-10-17 DIAGNOSIS — Z1231 Encounter for screening mammogram for malignant neoplasm of breast: Secondary | ICD-10-CM | POA: Diagnosis not present

## 2020-10-17 DIAGNOSIS — Z0000 Encounter for general adult medical examination without abnormal findings: Secondary | ICD-10-CM

## 2020-10-17 DIAGNOSIS — E039 Hypothyroidism, unspecified: Secondary | ICD-10-CM | POA: Diagnosis not present

## 2020-10-17 MED ORDER — LEVOTHYROXINE SODIUM 50 MCG PO TABS
ORAL_TABLET | ORAL | 1 refills | Status: DC
Start: 2020-10-17 — End: 2021-04-27

## 2020-10-17 MED ORDER — VALACYCLOVIR HCL 1 G PO TABS: ORAL_TABLET | ORAL | 0 refills | Status: DC

## 2020-10-17 MED ORDER — LEVOCETIRIZINE DIHYDROCHLORIDE 5 MG PO TABS: 5.0000 mg | ORAL_TABLET | Freq: Every evening | ORAL | 2 refills | Status: DC

## 2020-10-17 MED ORDER — AZELASTINE HCL 0.1 % NA SOLN: 1.0000 | Freq: Two times a day (BID) | NASAL | 2 refills | Status: DC

## 2020-10-17 NOTE — Progress Notes (Signed)
Patient ID: Ashley Ray, female    DOB: 1970-12-09, 50 y.o.   MRN: [id number]   Chief Complaint  Patient presents with  . Results  . Follow-up  . Hypothyroidism   Subjective:    HPI The patient comes in today for a wellness visit.  A review of their health history was completed.  A review of medications was also completed.  Any needed refills; levothyroxine. . Would like 90 days on refills because it is cheaper.   Eating habits: not health conscious  Falls/  MVA accidents in past few months: none  Regular exercise: walk daily  Specialist pt sees on regular basis: [facility] Dike [facility]  Preventative health issues were discussed.   Additional concerns: would like rx for valtrex for fever blisters  Sees gyn for pap.   Pt has fever blisters on lips, comes and goes.  More stress gets more frequently.  Dealing with 2 ill parents.   Pt stating has bad allergies.  Pt blowing her nose, constantly. Pt has seen allergist in past and did allergy injections in past when in [place]. Has high allergy to dust mites.  Medical History Aubry has a past medical history of Abnormal Pap smear of cervix, History of gastric surgery (07/25/2018), History of trichomoniasis (12/10/2016), Hypothyroid (02/08/2015), Pain in shoulder region (07/05/2015), Thyroid disease, Trichimoniasis, and Vulvar abscess (07/05/2015).   Outpatient Encounter Medications as of 10/17/2020  Medication Sig  . azelastine (ASTELIN) 0.1 % nasal spray Place 1 spray into both nostrils 2 (two) times daily. Use in each nostril as directed  . Biotin 1 MG CAPS Take by mouth.  . hydrochlorothiazide (HYDRODIURIL) 12.5 MG tablet Take 1 tablet (12.5 mg total) by mouth daily. (Patient taking differently: Take 12.5 mg by mouth.)  . levocetirizine (XYZAL) 5 MG tablet Take 1 tablet (5 mg total) by mouth every evening.  . nystatin cream (MYCOSTATIN) Apply 1 application topically 2 (two) times daily. (Patient taking  differently: Apply 1 application topically.)  . progesterone (PROMETRIUM) 200 MG capsule Take 1 at bedtime  . triamcinolone cream (KENALOG) 0.5 % Apply 1 application topically 2 (two) times daily.  . valACYclovir (VALTREX) 1000 MG tablet Take 1 tab p.o. for 5 days at onset of blister.  . [DISCONTINUED] fexofenadine (ALLEGRA) 180 MG tablet Take 180 mg by mouth daily.  . [DISCONTINUED] levothyroxine (EUTHYROX) 50 MCG tablet TAKE 1 TABLET BY MOUTH ONCE DAILY BEFORE BREAKFAST  . levothyroxine (EUTHYROX) 50 MCG tablet TAKE 1 TABLET BY MOUTH ONCE DAILY BEFORE BREAKFAST  . [DISCONTINUED] fluconazole (DIFLUCAN) 150 MG tablet Take 1 now and 1 in 3 days   No facility-administered encounter medications on file as of 10/17/2020.     Review of Systems  Constitutional: Negative for chills and fever.  HENT: Negative for congestion, rhinorrhea and sore throat.   Respiratory: Negative for cough, shortness of breath and wheezing.   Cardiovascular: Negative for chest pain and leg swelling.  Gastrointestinal: Negative for abdominal pain, diarrhea, nausea and vomiting.  Genitourinary: Negative for dysuria and frequency.  Musculoskeletal: Negative for arthralgias and back pain.  Skin: Negative for rash.  Neurological: Negative for dizziness, weakness and headaches.     Vitals BP 102/68   Pulse (!) 51   Temp 98.2 F (36.8 C)   Ht 5\' 6"  (1.676 m)   Wt 175 lb (79.4 kg)   SpO2 98%   BMI 28.25 kg/m   Objective:   Physical Exam Vitals and nursing note reviewed.  Constitutional:  Appearance: Normal appearance.  HENT:     Head: Normocephalic and atraumatic.     Nose: Nose normal.     Mouth/Throat:     Mouth: Mucous membranes are moist.     Pharynx: Oropharynx is clear.  Eyes:     Extraocular Movements: Extraocular movements intact.     Conjunctiva/sclera: Conjunctivae normal.     Pupils: Pupils are equal, round, and reactive to light.  Cardiovascular:     Rate and Rhythm: Regular rhythm.  Bradycardia present.     Pulses: Normal pulses.     Heart sounds: Normal heart sounds.  Pulmonary:     Effort: Pulmonary effort is normal. No respiratory distress.     Breath sounds: Normal breath sounds. No wheezing, rhonchi or rales.  Musculoskeletal:        General: Normal range of motion.     Right lower leg: No edema.     Left lower leg: No edema.  Skin:    General: Skin is warm and dry.     Findings: No lesion or rash.  Neurological:     General: No focal deficit present.     Mental Status: She is alert and oriented to person, place, and time.  Psychiatric:        Mood and Affect: Mood normal.        Behavior: Behavior normal.      Assessment and Plan   1. Well woman exam (no gynecological exam)  2. Hypothyroidism, unspecified type - levothyroxine (EUTHYROX) 50 MCG tablet; TAKE 1 TABLET BY MOUTH ONCE DAILY BEFORE BREAKFAST  Dispense: 90 tablet; Refill: 1  3. Herpes labialis - valACYclovir (VALTREX) 1000 MG tablet; Take 1 tab p.o. for 5 days at onset of blister.  Dispense: 20 tablet; Refill: 0  4. Encounter for screening mammogram for breast cancer - MM 3D SCREEN BREAST BILATERAL    HM-Need mammo, [place] [facility].  Well woman exam- pt stating seeing gyn for pap.  Herpes labialis- cont with valtrex prn.  Hypothyroid- stable. Cont meds.   Return in about 6 months (around 04/19/2021) for f/u thyroid, allergies.   10/23/2020

## 2020-10-23 ENCOUNTER — Encounter: Payer: Self-pay | Admitting: Family Medicine

## 2020-11-25 ENCOUNTER — Ambulatory Visit: Payer: BC Managed Care – PPO | Admitting: Family Medicine

## 2021-01-16 ENCOUNTER — Ambulatory Visit: Payer: BC Managed Care – PPO

## 2021-04-27 ENCOUNTER — Other Ambulatory Visit: Payer: Self-pay

## 2021-04-27 ENCOUNTER — Encounter: Payer: Self-pay | Admitting: Adult Health

## 2021-04-27 ENCOUNTER — Other Ambulatory Visit (HOSPITAL_COMMUNITY)
Admission: RE | Admit: 2021-04-27 | Discharge: 2021-04-27 | Disposition: A | Discharge status: Home / Self Care | Payer: BC Managed Care – PPO | Source: Ambulatory Visit | Attending: Adult Health | Admitting: Adult Health

## 2021-04-27 ENCOUNTER — Ambulatory Visit (INDEPENDENT_AMBULATORY_CARE_PROVIDER_SITE_OTHER): Payer: BC Managed Care – PPO | Admitting: Adult Health

## 2021-04-27 VITALS — BP 105/65 | HR 73 | Ht 66.0 in | Wt 184.8 lb

## 2021-04-27 DIAGNOSIS — N951 Menopausal and female climacteric states: Secondary | ICD-10-CM

## 2021-04-27 DIAGNOSIS — Z01419 Encounter for gynecological examination (general) (routine) without abnormal findings: Secondary | ICD-10-CM

## 2021-04-27 DIAGNOSIS — B001 Herpesviral vesicular dermatitis: Secondary | ICD-10-CM

## 2021-04-27 DIAGNOSIS — Z79890 Hormone replacement therapy (postmenopausal): Secondary | ICD-10-CM

## 2021-04-27 DIAGNOSIS — Z1211 Encounter for screening for malignant neoplasm of colon: Secondary | ICD-10-CM | POA: Insufficient documentation

## 2021-04-27 DIAGNOSIS — E039 Hypothyroidism, unspecified: Secondary | ICD-10-CM

## 2021-04-27 DIAGNOSIS — Z1212 Encounter for screening for malignant neoplasm of rectum: Secondary | ICD-10-CM

## 2021-04-27 DIAGNOSIS — R6882 Decreased libido: Secondary | ICD-10-CM

## 2021-04-27 DIAGNOSIS — B009 Herpesviral infection, unspecified: Secondary | ICD-10-CM | POA: Insufficient documentation

## 2021-04-27 LAB — HEMOCCULT GUIAC POC 1CARD (OFFICE): Fecal Occult Blood, POC: NEGATIVE

## 2021-04-27 MED ORDER — VALACYCLOVIR HCL 1 G PO TABS: ORAL_TABLET | ORAL | 3 refills | Status: DC

## 2021-04-27 MED ORDER — ESTRADIOL 1 MG PO TABS: 1.0000 mg | ORAL_TABLET | Freq: Every day | ORAL | 3 refills | Status: DC

## 2021-04-27 MED ORDER — PROGESTERONE 200 MG PO CAPS: ORAL_CAPSULE | ORAL | 3 refills | Status: DC

## 2021-04-27 MED ORDER — LEVOTHYROXINE SODIUM 50 MCG PO TABS
ORAL_TABLET | ORAL | 1 refills | Status: DC
Start: 2021-04-27 — End: 2021-09-06

## 2021-04-27 NOTE — Progress Notes (Signed)
Patient ID: Ashley Ray, female   DOB: 1970-10-09, 50 y.o.   MRN: [id number] History of Present Illness: Ashley Ray is a 50 year old white female,married, in for well woman gyn exam and requests pap. She has had hard year, her mom and dad and a nephew died this year. She is tired,has decreased sex drive, and take longer for orgasm.  Lab Results  Component Value Date   DIAGPAP  02/08/2020    - Negative for intraepithelial lesion or malignancy (NILM)   HPV NOT DETECTED 12/19/2017   HPVHIGH Negative 02/08/2020    Current Medications, Allergies, Past Medical History, Past Surgical History, Family History and Social History were reviewed in [employer] record.     Review of Systems: Patient denies any headaches, hearing loss,  blurred vision, shortness of breath, chest pain, abdominal pain, problems with bowel movements, urination, or intercourse. No joint pain or mood swings.  See HPI for positives.   Physical Exam:BP 105/65 (BP Location: Left Arm, Patient Position: Sitting, Cuff Size: Normal)   Pulse 73   Ht 5\' 6"  (1.676 m)   Wt 184 lb 12.8 oz (83.8 kg)   LMP 04/18/2021   BMI 29.83 kg/m   General:  Well developed, well nourished, no acute distress Skin:  Warm and dry,has cold sores. Neck:  Midline trachea, normal thyroid, good ROM, no lymphadenopathy Lungs; Clear to auscultation bilaterally Breast:  No dominant palpable mass, retraction, or nipple discharge Cardiovascular: Regular rate and rhythm Abdomen:  Soft, non tender, no hepatosplenomegaly Pelvic:  External genitalia is normal in appearance, no lesions.  The vagina is normal in appearance. Urethra has no lesions or masses. The cervix is bulbous.Pap with HR HPV genotyping performed.  Uterus is felt to be normal size, shape, and contour.  No adnexal masses or tenderness noted.Bladder is non tender, no masses felt. Rectal: Good sphincter tone, no polyps, or hemorrhoids felt.  Hemoccult  negative. Extremities/musculoskeletal:  No swelling or varicosities noted, no clubbing or cyanosis Psych:  No mood changes, alert and cooperative,seems happy AA is 0  Fall risk is low Depression screen [facility] 2/9 04/27/2021 07/19/2020 02/08/2020  Decreased Interest 0 0 0  Down, Depressed, Hopeless 0 0 0  PHQ - 2 Score 0 0 0  Altered sleeping 0 - 0  Tired, decreased energy 0 - 0  Change in appetite 0 - 0  Feeling bad or failure about yourself  0 - 0  Trouble concentrating 0 - 0  Moving slowly or fidgety/restless 0 - 0  Suicidal thoughts 0 - 0  PHQ-9 Score 0 - 0  Difficult doing work/chores - - -    GAD 7 : Generalized Anxiety Score 04/27/2021 02/08/2020  Nervous, Anxious, on Edge 0 0  Control/stop worrying 0 0  Worry too much - different things 0 0  Trouble relaxing 0 0  Restless 0 0  Easily annoyed or irritable 0 0  Afraid - awful might happen 0 0  Total GAD 7 Score 0 0      Upstream - 04/27/21 1502       Pregnancy Intention Screening   Does the patient want to become pregnant in the next year? No    Does the patient's partner want to become pregnant in the next year? No    Would the patient like to discuss contraceptive options today? No      Contraception Wrap Up   Current Method Vasectomy    End Method Vasectomy    Contraception Counseling Provided No  Examination chaperoned by Faith Rogue LPN   Impression and Plan: 1. Encounter for gynecological examination with Papanicolaou smear of cervix Pap sent Physical in 1 year Pap in 3 if normal Mammogram yearly  2. Encounter for screening fecal occult blood testing   3. 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 Perimenopause   6. Hypothyroidism, unspecified type Will refill levothyroxine  7. Hormone replacement therapy (HRT) Will refill Prometrium  Meds ordered this encounter  Medications   estradiol (ESTRACE) 1 MG tablet    Sig: Take 1 tablet (1  mg total) by mouth daily.    Dispense:  90 tablet    Refill:  3    Order Specific Question:   Supervising Provider    Answer:   Duane Lope H [[id number]]   valACYclovir (VALTREX) 1000 MG tablet    Sig: Take 2 at time of cold sore and 2 the next day    Dispense:  20 tablet    Refill:  3    Order Specific Question:   Supervising Provider    Answer:   Duane Lope H [[id number]]   progesterone (PROMETRIUM) 200 MG capsule    Sig: Take 1 at bedtime    Dispense:  90 capsule    Refill:  3    Order Specific Question:   Supervising Provider    Answer:   Lazaro Arms [[id number]]   levothyroxine (EUTHYROX) 50 MCG tablet    Sig: TAKE 1 TABLET BY MOUTH ONCE DAILY BEFORE BREAKFAST    Dispense:  90 tablet    Refill:  1    Order Specific Question:   Supervising Provider    Answer:   EURE, LUTHER H [[id number]]     8. Herpes simplex   9. Screening for colorectal cancer Ordered cologuard

## 2021-05-01 LAB — CYTOLOGY - PAP
Comment: NEGATIVE
Diagnosis: NEGATIVE
High risk HPV: NEGATIVE

## 2021-05-14 LAB — COLOGUARD: Cologuard: NEGATIVE

## 2021-05-29 ENCOUNTER — Other Ambulatory Visit: Payer: Self-pay | Admitting: Adult Health

## 2021-05-29 DIAGNOSIS — Z1231 Encounter for screening mammogram for malignant neoplasm of breast: Secondary | ICD-10-CM

## 2021-07-04 ENCOUNTER — Ambulatory Visit: Payer: BC Managed Care – PPO

## 2021-08-02 ENCOUNTER — Ambulatory Visit
Admission: RE | Admit: 2021-08-02 | Discharge: 2021-08-02 | Disposition: A | Discharge status: Home / Self Care | Payer: BC Managed Care – PPO | Source: Ambulatory Visit | Attending: Adult Health | Admitting: Adult Health

## 2021-08-02 DIAGNOSIS — Z1231 Encounter for screening mammogram for malignant neoplasm of breast: Secondary | ICD-10-CM

## 2021-08-03 ENCOUNTER — Other Ambulatory Visit: Payer: Self-pay | Admitting: Adult Health

## 2021-08-03 DIAGNOSIS — R928 Other abnormal and inconclusive findings on diagnostic imaging of breast: Secondary | ICD-10-CM

## 2021-08-07 ENCOUNTER — Telehealth: Payer: Self-pay | Admitting: Adult Health

## 2021-08-07 NOTE — Telephone Encounter (Signed)
Patient called stating that she would like to stop taking her medication, please contact patient today.

## 2021-08-23 ENCOUNTER — Other Ambulatory Visit: Payer: Self-pay | Admitting: Adult Health

## 2021-08-23 MED ORDER — TRAZODONE HCL 50 MG PO TABS: 50.0000 mg | ORAL_TABLET | Freq: Every day | ORAL | 0 refills | Status: DC

## 2021-08-23 NOTE — Progress Notes (Signed)
Rx trazodone for sleep.  

## 2021-08-28 ENCOUNTER — Other Ambulatory Visit: Payer: Self-pay | Admitting: Adult Health

## 2021-08-28 MED ORDER — FLUCONAZOLE 150 MG PO TABS: ORAL_TABLET | ORAL | 1 refills | Status: DC

## 2021-08-28 NOTE — Progress Notes (Signed)
Rx diflucan.  

## 2021-08-30 ENCOUNTER — Ambulatory Visit
Admission: RE | Admit: 2021-08-30 | Discharge: 2021-08-30 | Disposition: A | Discharge status: Home / Self Care | Payer: BC Managed Care – PPO | Source: Ambulatory Visit | Attending: Adult Health | Admitting: Adult Health

## 2021-08-30 DIAGNOSIS — R928 Other abnormal and inconclusive findings on diagnostic imaging of breast: Secondary | ICD-10-CM

## 2021-09-06 ENCOUNTER — Other Ambulatory Visit: Payer: Self-pay | Admitting: Adult Health

## 2021-09-06 DIAGNOSIS — E039 Hypothyroidism, unspecified: Secondary | ICD-10-CM

## 2021-09-06 MED ORDER — LEVOTHYROXINE SODIUM 50 MCG PO TABS: ORAL_TABLET | ORAL | 1 refills | Status: DC

## 2021-09-06 MED ORDER — TRAZODONE HCL 50 MG PO TABS: 50.0000 mg | ORAL_TABLET | Freq: Every day | ORAL | 30 refills | Status: DC

## 2021-09-06 NOTE — Progress Notes (Signed)
Refilled levothyroxine and trazodone

## 2021-09-07 ENCOUNTER — Other Ambulatory Visit: Payer: BC Managed Care – PPO

## 2021-10-10 ENCOUNTER — Ambulatory Visit: Payer: BC Managed Care – PPO | Admitting: Family Medicine

## 2021-10-10 ENCOUNTER — Other Ambulatory Visit: Payer: Self-pay

## 2021-10-10 VITALS — BP 100/60 | HR 70 | Temp 98.3°F | Wt 185.6 lb

## 2021-10-10 DIAGNOSIS — Z9889 Other specified postprocedural states: Secondary | ICD-10-CM

## 2021-10-10 DIAGNOSIS — R5383 Other fatigue: Secondary | ICD-10-CM

## 2021-10-10 NOTE — Assessment & Plan Note (Signed)
Ongoing fatigue.  Low BPs which she states is unusual for her.  She is working on increasing her hydration.  No presyncope/dizziness/lightheadedness.  Orthostatics were negative today.  Laboratory studies for further evaluation today.  Advised to stay hydrated and increase her salt intake.  Supportive care. ?

## 2021-10-10 NOTE — Progress Notes (Signed)
? ?Subjective:  ?Patient ID: Ashley Ray, female    DOB: [birth year]  Age: 51 y.o. MRN: [id number] ? ?CC: ?Chief Complaint  ?Patient presents with  ? blood pressure running low  ? Fatigue  ?  Has been going on for about a month. B/P has been running 99/60 at orthopedic doctor yesterday  and 104/60 last night.  ? ? ?HPI: ? ?51 year old female presents with fatigue and reported low blood pressure readings. ? ?Patient reports that she has had ongoing fatigue for about a month.  Uncertain etiology per her report.  She recently saw her orthopedist yesterday.  BP was 99/60.  Her BPs have been low at home as well.  She states that last night it was 104/60.  BP 100/60 today.  She denies presyncope, dizziness, lightheadedness.  She just endorses fatigue.  She has not had laboratory studies since 2021.  She is not taking her supplements that are recommended for being post bariatric surgery.  No chest pain or shortness of breath.  She feels otherwise well.  No other complaints or concerns at this time. ? ?Patient Active Problem List  ? Diagnosis Date Noted  ? Fatigue 10/10/2021  ? Hypothyroidism 04/27/2021  ? 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 Papanicolaou smear of cervix with positive high risk human papilloma virus (HPV) test 02/08/2020  ? Perimenopause 12/17/2019  ? 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 ? ?Social Hx   ?Social History  ? ?Socioeconomic History  ? Marital status: Married  ?  Spouse name: Not on file  ? Number of children: 1  ? Years of education: Not on file  ? Highest education level: Not on file  ?Occupational History  ? Not on file  ?Tobacco Use  ? Smoking status: Former  ?  Years: 15.00  ?  Types: Cigarettes  ? Smokeless tobacco: Never  ?Vaping Use  ? Vaping Use: Never used  ?Substance and Sexual Activity  ? Alcohol use: No  ? Drug use: No  ? Sexual activity: Yes  ?  Birth control/protection: Surgical  ?  Comment: vasectomy  ?Other Topics Concern  ? Not on file  ?Social History Narrative  ?  Not on file  ? ?Social Determinants of Health  ? ?Financial Resource Strain: Low Risk   ? Difficulty of Paying Living Expenses: Not hard at all  ?Food Insecurity: No Food Insecurity  ? Worried About Charity fundraiser in the Last Year: Never true  ? Ran Out of Food in the Last Year: Never true  ?Transportation Needs: No Transportation Needs  ? Lack of Transportation (Medical): No  ? Lack of Transportation (Non-Medical): No  ?Physical Activity: Inactive  ? Days of Exercise per Week: 0 days  ? Minutes of Exercise per Session: 0 min  ?Stress: No Stress Concern Present  ? Feeling of Stress : Not at all  ?Social Connections: Socially Integrated  ? Frequency of Communication with Friends and Family: More than three times a week  ? Frequency of Social Gatherings with Friends and Family: More than three times a week  ? Attends Religious Services: 1 to 4 times per year  ? Active Member of Clubs or Organizations: Yes  ? Attends Archivist Meetings: More than 4 times per year  ? Marital Status: Married  ? ? ?Review of Systems ?Per HPI ? ?Objective:  ?BP 100/60   Pulse 70   Temp 98.3 ?F (36.8 ?C) (Oral)   Wt 185 lb 9.6 oz (84.2 kg)  SpO2 97%   BMI 29.96 kg/m?  ? ?BP/Weight 10/10/2021 04/27/2021 10/17/2020  ?Systolic BP 383 291 916  ?Diastolic BP 60 65 68  ?Wt. (Lbs) 185.6 184.8 175  ?BMI 29.96 29.83 28.25  ? ? ?Physical Exam ? ?Lab Results  ?Component Value Date  ? WBC 7.0 07/19/2020  ? HGB 12.7 07/19/2020  ? HCT 38.1 07/19/2020  ? PLT 202 07/19/2020  ? GLUCOSE 88 07/19/2020  ? CHOL 200 (H) 07/19/2020  ? TRIG 84 07/19/2020  ? HDL 99 07/19/2020  ? [place] 86 07/19/2020  ? ALT 16 07/19/2020  ? AST 17 07/19/2020  ? NA 136 07/19/2020  ? K 4.3 07/19/2020  ? CL 101 07/19/2020  ? CREATININE 0.77 07/19/2020  ? BUN 19 07/19/2020  ? CO2 23 07/19/2020  ? TSH 0.944 07/19/2020  ? ? ? ?Assessment & Plan:  ? ?Problem List Items Addressed This Visit   ? ?  ? Other  ? History of gastric surgery  ? Relevant Orders  ? CBC  ?  CMP14+EGFR  ? Lipid panel  ? Vitamin B12  ? Folate  ? Iron, TIBC and Ferritin Panel  ? Vitamin B1  ? Copper, serum  ? Zinc  ? TSH + free T4  ? Vitamin D (25 hydroxy)  ? Parathyroid hormone, intact (no Ca)  ? Vitamin A  ? Fatigue - Primary  ?  Ongoing fatigue.  Low BPs which she states is unusual for her.  She is working on increasing her hydration.  No presyncope/dizziness/lightheadedness.  Orthostatics were negative today.  Laboratory studies for further evaluation today.  Advised to stay hydrated and increase her salt intake.  Supportive care. ?  ?  ? ? ?Thersa Salt DO ?[place] ? ?

## 2021-10-10 NOTE — Patient Instructions (Signed)
Stay hydrated. ? ?Add a little more salt. ? ?Labs for further evaluation today. ? ?Take care ? ?Dr. Adriana Simas  ?

## 2021-10-11 ENCOUNTER — Encounter: Payer: Self-pay | Admitting: Family Medicine

## 2021-10-12 ENCOUNTER — Other Ambulatory Visit: Payer: Self-pay | Admitting: Family

## 2021-10-12 ENCOUNTER — Other Ambulatory Visit: Payer: Self-pay | Admitting: Family Medicine

## 2021-10-12 DIAGNOSIS — M25462 Effusion, left knee: Secondary | ICD-10-CM

## 2021-10-12 DIAGNOSIS — M25362 Other instability, left knee: Secondary | ICD-10-CM

## 2021-10-12 DIAGNOSIS — M2392 Unspecified internal derangement of left knee: Secondary | ICD-10-CM

## 2021-10-12 DIAGNOSIS — E039 Hypothyroidism, unspecified: Secondary | ICD-10-CM

## 2021-10-12 DIAGNOSIS — G8929 Other chronic pain: Secondary | ICD-10-CM

## 2021-10-12 MED ORDER — LEVOTHYROXINE SODIUM 25 MCG PO TABS: ORAL_TABLET | ORAL | 0 refills | Status: DC

## 2021-10-16 LAB — CMP14+EGFR
ALT: 15 IU/L (ref 0–32)
AST: 11 IU/L (ref 0–40)
Albumin/Globulin Ratio: 1.9 (ref 1.2–2.2)
Albumin: 4.6 g/dL (ref 3.8–4.8)
Alkaline Phosphatase: 57 IU/L (ref 44–121)
BUN/Creatinine Ratio: 36 — ABNORMAL HIGH (ref 9–23)
BUN: 26 mg/dL — ABNORMAL HIGH (ref 6–24)
Bilirubin Total: 0.4 mg/dL (ref 0.0–1.2)
CO2: 20 mmol/L (ref 20–29)
Calcium: 10 mg/dL (ref 8.7–10.2)
Chloride: 102 mmol/L (ref 96–106)
Creatinine, Ser: 0.73 mg/dL (ref 0.57–1.00)
Globulin, Total: 2.4 g/dL (ref 1.5–4.5)
Glucose: 127 mg/dL — ABNORMAL HIGH (ref 70–99)
Potassium: 4.2 mmol/L (ref 3.5–5.2)
Sodium: 136 mmol/L (ref 134–144)
Total Protein: 7 g/dL (ref 6.0–8.5)
eGFR: 100 mL/min/{1.73_m2} (ref >59)

## 2021-10-16 LAB — FOLATE: Folate: 5.8 ng/mL (ref >3.0)

## 2021-10-16 LAB — VITAMIN D 25 HYDROXY (VIT D DEFICIENCY, FRACTURES): Vit D, 25-Hydroxy: 31.9 ng/mL (ref 30.0–100.0)

## 2021-10-16 LAB — LIPID PANEL
Chol/HDL Ratio: 2 ratio (ref 0.0–4.4)
Cholesterol, Total: 188 mg/dL (ref 100–199)
HDL: 92 mg/dL (ref >39)
LDL Chol Calc (NIH): 81 mg/dL (ref 0–99)
Triglycerides: 81 mg/dL (ref 0–149)
VLDL Cholesterol Cal: 15 mg/dL (ref 5–40)

## 2021-10-16 LAB — VITAMIN A: Vitamin A: 42.8 ug/dL (ref 20.1–62.0)

## 2021-10-16 LAB — TSH+FREE T4
Free T4: 1.16 ng/dL (ref 0.82–1.77)
TSH: 0.344 u[IU]/mL — ABNORMAL LOW (ref 0.450–4.500)

## 2021-10-16 LAB — CBC
Hematocrit: 41.2 % (ref 34.0–46.6)
Hemoglobin: 13.9 g/dL (ref 11.1–15.9)
MCH: 30.4 pg (ref 26.6–33.0)
MCHC: 33.7 g/dL (ref 31.5–35.7)
MCV: 90 fL (ref 79–97)
Platelets: 246 10*3/uL (ref 150–450)
RBC: 4.57 x10E6/uL (ref 3.77–5.28)
RDW: 12 % (ref 11.7–15.4)
WBC: 10.7 10*3/uL (ref 3.4–10.8)

## 2021-10-16 LAB — VITAMIN B1: Thiamine: 149.3 nmol/L (ref 66.5–200.0)

## 2021-10-16 LAB — IRON,TIBC AND FERRITIN PANEL
Ferritin: 76 ng/mL (ref 15–150)
Iron Saturation: 31 % (ref 15–55)
Iron: 115 ug/dL (ref 27–159)
Total Iron Binding Capacity: 366 ug/dL (ref 250–450)
UIBC: 251 ug/dL (ref 131–425)

## 2021-10-16 LAB — ZINC: Zinc: 78 ug/dL (ref 44–115)

## 2021-10-16 LAB — COPPER, SERUM: Copper: 139 ug/dL (ref 80–158)

## 2021-10-16 LAB — VITAMIN B12: Vitamin B-12: 652 pg/mL (ref 232–1245)

## 2021-10-16 LAB — PARATHYROID HORMONE, INTACT (NO CA): PTH: 21 pg/mL (ref 15–65)

## 2021-10-23 ENCOUNTER — Other Ambulatory Visit: Payer: BC Managed Care – PPO

## 2021-11-02 ENCOUNTER — Other Ambulatory Visit: Payer: Self-pay | Admitting: Family Medicine

## 2021-12-20 ENCOUNTER — Encounter: Payer: Self-pay | Admitting: Family Medicine

## 2021-12-20 ENCOUNTER — Other Ambulatory Visit: Payer: Self-pay | Admitting: Adult Health

## 2021-12-20 DIAGNOSIS — B009 Herpesviral infection, unspecified: Secondary | ICD-10-CM

## 2021-12-20 MED ORDER — VALACYCLOVIR HCL 1 G PO TABS: ORAL_TABLET | ORAL | 3 refills | Status: DC

## 2021-12-20 NOTE — Progress Notes (Signed)
Refilled valtrex  

## 2021-12-25 ENCOUNTER — Ambulatory Visit: Payer: BC Managed Care – PPO | Admitting: Family Medicine

## 2021-12-25 ENCOUNTER — Encounter: Payer: Self-pay | Admitting: Family Medicine

## 2021-12-25 VITALS — BP 91/66 | HR 68 | Temp 97.9°F | Ht 66.0 in | Wt 183.0 lb

## 2021-12-25 DIAGNOSIS — I83813 Varicose veins of bilateral lower extremities with pain: Secondary | ICD-10-CM | POA: Diagnosis not present

## 2021-12-25 DIAGNOSIS — E039 Hypothyroidism, unspecified: Secondary | ICD-10-CM

## 2021-12-25 NOTE — Progress Notes (Signed)
Subjective:  Patient ID: Ashley Ray, female    DOB: 19-May-1971  Age: 51 y.o. MRN: [id number]  CC: Chief Complaint  Patient presents with   lep pain and swelling     Veins     HPI:  51 year old female with hypothyroidism, history of gastric sleeve presents for evaluation of the above.  Patient reports ongoing pain of the lower extremities.  She states that she has quite extensive varicose veins and this causes lower extremity edema as well as heaviness and pain.  She would like to see a vascular surgeon to discuss treatment options particular surgery.  She has tried compression stockings previously.  Patient's most recent TSH was suppressed.  Her dose of Synthroid was decreased.  Patient would like her thyroid to be rechecked today.  She endorses compliance with Synthroid 25 mcg daily.  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    Social Hx   Social History   Socioeconomic History   Marital status: Married    Spouse name: Not on file   Number of children: 1   Years of education: Not on file   Highest education level: Not on file  Occupational History   Not on file  Tobacco Use   Smoking status: Former    Years: 15.00    Types: Cigarettes   Smokeless tobacco: Never  Vaping Use   Vaping Use: Never used  Substance and Sexual Activity   Alcohol use: No   Drug use: No   Sexual activity: Yes    Birth control/protection: Surgical    Comment: vasectomy  Other Topics Concern   Not on file  Social History Narrative   Not on file   Social Determinants of Health   Financial Resource Strain: Low Risk    Difficulty of Paying Living Expenses: Not hard at all  Food  Insecurity: No Food Insecurity   Worried About Charity fundraiser in the Last Year: Never true   Ran Out of Food in the Last Year: Never true  Transportation Needs: No Transportation Needs   Lack of Transportation (Medical): No   Lack of Transportation (Non-Medical): No  Physical Activity: Inactive   Days of Exercise per Week: 0 days   Minutes of Exercise per Session: 0 min  Stress: No Stress Concern Present   Feeling of Stress : Not at all  Social Connections: Socially Integrated   Frequency of Communication with Friends and Family: More than three times a week   Frequency of Social Gatherings with Friends and Family: More than three times a week   Attends Religious Services: 1 to 4 times per year   Active Member of [employer] or Organizations: Yes   Attends Music therapist: More than 4 times per year   Marital Status: Married    Review of Systems  Constitutional:  Positive for fatigue.  Cardiovascular:  Positive for leg swelling.    Objective:  BP 91/66   Pulse 68   Temp 97.9 F (36.6 C)   Ht 5\' 6"  (1.676 m)   Wt 183 lb (83 kg)   SpO2 100%   BMI 29.54 kg/m      12/25/2021    8:56 AM  10/10/2021    1:12 PM 04/27/2021    2:57 PM  BP/Weight  Systolic BP 91 [id number]  Diastolic BP 66 60 65  Wt. (Lbs) 183 185.6 184.8  BMI 29.54 kg/m2 29.96 kg/m2 29.83 kg/m2    Physical Exam Vitals and nursing note reviewed.  Constitutional:      General: She is not in acute distress.    Appearance: Normal appearance.  HENT:     Head: Normocephalic and atraumatic.  Eyes:     General:        Right eye: No discharge.        Left eye: No discharge.     Conjunctiva/sclera: Conjunctivae normal.  Cardiovascular:     Rate and Rhythm: Normal rate and regular rhythm.  Pulmonary:     Effort: Pulmonary effort is normal.     Breath sounds: Normal breath sounds. No wheezing, rhonchi or rales.  Skin:    Comments: Varicosities noted of the lower extremities, left greater than right.   Neurological:     Mental Status: She is alert.    Lab Results  Component Value Date   WBC 10.7 10/10/2021   HGB 13.9 10/10/2021   HCT 41.2 10/10/2021   PLT 246 10/10/2021   GLUCOSE 127 (H) 10/10/2021   CHOL 188 10/10/2021   TRIG 81 10/10/2021   HDL 92 10/10/2021   LDLCALC 81 10/10/2021   ALT 15 10/10/2021   AST 11 10/10/2021   NA 136 10/10/2021   K 4.2 10/10/2021   CL 102 10/10/2021   CREATININE 0.73 10/10/2021   BUN 26 (H) 10/10/2021   CO2 20 10/10/2021   TSH 0.344 (L) 10/10/2021     Assessment & Plan:   Problem List Items Addressed This Visit       Cardiovascular and Mediastinum   Varicose veins of both lower extremities with pain - Primary    Referring to vascular surgery.       Relevant Orders   Ambulatory referral to Vascular Surgery     Endocrine   Hypothyroidism    Assessing TSH today.  We will alter dosing if needed.       Relevant Orders   TSH    Follow-up:  Return in about 6 months (around 06/26/2022).  [place]

## 2021-12-25 NOTE — Assessment & Plan Note (Signed)
Referring to vascular surgery. 

## 2021-12-25 NOTE — Patient Instructions (Signed)
TSH today ([facility]).  I have placed the referral.  Follow up in 6 months or sooner if needed.  Take care  Dr. Adriana Simas

## 2021-12-25 NOTE — Assessment & Plan Note (Signed)
Assessing TSH today.  We will alter dosing if needed.

## 2021-12-26 LAB — TSH: TSH: 2.74 u[IU]/mL (ref 0.450–4.500)

## 2022-01-05 ENCOUNTER — Ambulatory Visit: Payer: BC Managed Care – PPO | Admitting: Family Medicine

## 2022-01-05 DIAGNOSIS — J988 Other specified respiratory disorders: Secondary | ICD-10-CM | POA: Diagnosis not present

## 2022-01-05 MED ORDER — AMOXICILLIN-POT CLAVULANATE 875-125 MG PO TABS: 1.0000 | ORAL_TABLET | Freq: Two times a day (BID) | ORAL | 0 refills | Status: DC

## 2022-01-05 MED ORDER — PROMETHAZINE-DM 6.25-15 MG/5ML PO SYRP: 5.0000 mL | ORAL_SOLUTION | Freq: Four times a day (QID) | ORAL | 0 refills | Status: DC | PRN

## 2022-01-05 NOTE — Patient Instructions (Signed)
Medication as prescribed.  Lots of rest and fluids. Voice rest.  Take care  Dr. Adriana Simas

## 2022-01-05 NOTE — Progress Notes (Signed)
Subjective:  Patient ID: Ashley Ray, female    DOB: Oct 05, 1970  Age: 51 y.o. MRN: [id number]  CC: Chief Complaint  Patient presents with   Cough    Patient says illness is going around at work.  Coughing up green phlegm.   Sinus Problem   Nasal Congestion   Hoarse   Headache    HPI:  51 year old female presents for evaluation of the above.  Patient reports that she has been sick for the past 3 days.  She reports congestion, sore throat, painful swallowing, headache, productive cough.  She also reports hoarseness.  No relief with over-the-counter treatment.  No fever.  She has had recent sick contacts at work with similar illness.  She states that none of them have tested positive for COVID-19 or for strep.  She feels poorly.  No other complaints or concerns at this time.  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    Social Hx   Social History   Socioeconomic History   Marital status: Married    Spouse name: Not on file   Number of children: 1   Years of education: Not on file   Highest education level: Not on file  Occupational History   Not on file  Tobacco Use   Smoking status: Former    Years: 15.00    Types: Cigarettes   Smokeless tobacco: Never  Vaping Use   Vaping Use: Never used  Substance and Sexual Activity   Alcohol use: No   Drug use: No   Sexual activity: Yes    Birth control/protection: Surgical    Comment: vasectomy  Other Topics Concern   Not on file  Social History Narrative   Not on file   Social Determinants of Health   Financial Resource Strain: Low Risk  (04/27/2021)   Overall Financial Resource Strain (CARDIA)     Difficulty of Paying Living Expenses: Not hard at all  Food Insecurity: No Food Insecurity (04/27/2021)   Hunger Vital Sign    Worried About Running Out of Food in the Last Year: Never true    Ran Out of Food in the Last Year: Never true  Transportation Needs: No Transportation Needs (04/27/2021)   PRAPARE - Administrator, Civil Service (Medical): No    Lack of Transportation (Non-Medical): No  Physical Activity: Inactive (04/27/2021)   Exercise Vital Sign    Days of Exercise per Week: 0 days    Minutes of Exercise per Session: 0 min  Stress: No Stress Concern Present (04/27/2021)   [employer] of Occupational Health - Occupational Stress Questionnaire    Feeling of Stress : Not at all  Social Connections: Socially Integrated (04/27/2021)   Social Connection and Isolation Panel [NHANES]    Frequency of Communication with Friends and Family: More than three times a week    Frequency of Social Gatherings with Friends and Family: More than three times a week    Attends Religious Services: 1 to 4 times per year    Active Member of [employer] or Organizations: Yes    Attends Engineer, structural: More than 4 times per year    Marital Status: Married  Review of Systems Per HPI  Objective:  BP 128/81   Pulse 65   Temp 98.5 F (36.9 C) (Oral)   Wt 181 lb 9.6 oz (82.4 kg)   SpO2 99%   BMI 29.31 kg/m      01/05/2022    9:13 AM 12/25/2021    8:56 AM 10/10/2021    1:12 PM  BP/Weight  Systolic BP 128 91 100  Diastolic BP 81 66 60  Wt. (Lbs) 181.6 183 185.6  BMI 29.31 kg/m2 29.54 kg/m2 29.96 kg/m2    Physical Exam Vitals and nursing note reviewed.  Constitutional:      General: She is not in acute distress. HENT:     Head: Normocephalic and atraumatic.     Right Ear: Tympanic membrane normal.     Left Ear: Tympanic membrane normal.     Mouth/Throat:     Pharynx: Posterior oropharyngeal erythema present. No oropharyngeal exudate.  Eyes:     General:         Right eye: No discharge.        Left eye: No discharge.     Conjunctiva/sclera: Conjunctivae normal.  Cardiovascular:     Rate and Rhythm: Normal rate and regular rhythm.     Heart sounds: No murmur heard. Pulmonary:     Effort: Pulmonary effort is normal.     Breath sounds: Normal breath sounds. No wheezing, rhonchi or rales.  Neurological:     Mental Status: She is alert.     Lab Results  Component Value Date   WBC 10.7 10/10/2021   HGB 13.9 10/10/2021   HCT 41.2 10/10/2021   PLT 246 10/10/2021   GLUCOSE 127 (H) 10/10/2021   CHOL 188 10/10/2021   TRIG 81 10/10/2021   HDL 92 10/10/2021   LDLCALC 81 10/10/2021   ALT 15 10/10/2021   AST 11 10/10/2021   NA 136 10/10/2021   K 4.2 10/10/2021   CL 102 10/10/2021   CREATININE 0.73 10/10/2021   BUN 26 (H) 10/10/2021   CO2 20 10/10/2021   TSH 2.740 12/25/2021     Assessment & Plan:   Problem List Items Addressed This Visit       Respiratory   Respiratory infection    Treating with Augmentin & Promethazine DM.       Meds ordered this encounter  Medications   amoxicillin-clavulanate (AUGMENTIN) 875-125 MG tablet    Sig: Take 1 tablet by mouth 2 (two) times daily.    Dispense:  14 tablet    Refill:  0   promethazine-dextromethorphan (PROMETHAZINE-DM) 6.25-15 MG/5ML syrup    Sig: Take 5 mLs by mouth 4 (four) times daily as needed for cough.    Dispense:  118 mL    Refill:  0    Gemayel Mascio DO [facility]

## 2022-01-05 NOTE — Assessment & Plan Note (Signed)
Treating with Augmentin & Promethazine DM.

## 2022-01-07 ENCOUNTER — Other Ambulatory Visit: Payer: Self-pay | Admitting: Family Medicine

## 2022-01-07 DIAGNOSIS — E039 Hypothyroidism, unspecified: Secondary | ICD-10-CM

## 2022-01-08 ENCOUNTER — Other Ambulatory Visit: Payer: Self-pay | Admitting: Family Medicine

## 2022-01-08 DIAGNOSIS — E039 Hypothyroidism, unspecified: Secondary | ICD-10-CM

## 2022-01-12 ENCOUNTER — Other Ambulatory Visit: Payer: Self-pay | Admitting: *Deleted

## 2022-01-12 DIAGNOSIS — I83813 Varicose veins of bilateral lower extremities with pain: Secondary | ICD-10-CM

## 2022-01-17 ENCOUNTER — Other Ambulatory Visit: Payer: Self-pay | Admitting: Family Medicine

## 2022-01-17 MED ORDER — PROMETHAZINE-DM 6.25-15 MG/5ML PO SYRP
5.0000 mL | ORAL_SOLUTION | Freq: Four times a day (QID) | ORAL | 0 refills | Status: DC | PRN
Start: 2022-01-17 — End: 2022-04-03

## 2022-01-30 ENCOUNTER — Encounter: Payer: BC Managed Care – PPO | Admitting: Vascular Surgery

## 2022-01-30 ENCOUNTER — Encounter (HOSPITAL_COMMUNITY): Payer: BC Managed Care – PPO

## 2022-02-16 ENCOUNTER — Encounter: Payer: Self-pay | Admitting: Family Medicine

## 2022-02-19 ENCOUNTER — Other Ambulatory Visit: Payer: Self-pay | Admitting: Adult Health

## 2022-02-19 MED ORDER — FLUCONAZOLE 150 MG PO TABS: ORAL_TABLET | ORAL | 1 refills | Status: DC

## 2022-02-19 NOTE — Progress Notes (Signed)
Rx sent for diflucan

## 2022-03-02 ENCOUNTER — Encounter (HOSPITAL_COMMUNITY): Payer: BC Managed Care – PPO

## 2022-03-02 ENCOUNTER — Encounter: Payer: BC Managed Care – PPO | Admitting: Vascular Surgery

## 2022-03-31 ENCOUNTER — Other Ambulatory Visit: Payer: Self-pay | Admitting: Family Medicine

## 2022-03-31 DIAGNOSIS — E039 Hypothyroidism, unspecified: Secondary | ICD-10-CM

## 2022-04-03 ENCOUNTER — Other Ambulatory Visit: Payer: Self-pay | Admitting: Family Medicine

## 2022-04-03 MED ORDER — PROMETHAZINE-DM 6.25-15 MG/5ML PO SYRP: 5.0000 mL | ORAL_SOLUTION | Freq: Four times a day (QID) | ORAL | 0 refills | Status: DC | PRN

## 2022-04-09 ENCOUNTER — Ambulatory Visit (HOSPITAL_COMMUNITY)
Admission: RE | Admit: 2022-04-09 | Discharge: 2022-04-09 | Disposition: A | Discharge status: Home / Self Care | Payer: BC Managed Care – PPO | Source: Ambulatory Visit | Attending: Vascular Surgery | Admitting: Vascular Surgery

## 2022-04-09 ENCOUNTER — Ambulatory Visit (INDEPENDENT_AMBULATORY_CARE_PROVIDER_SITE_OTHER): Payer: BC Managed Care – PPO | Admitting: Physician Assistant

## 2022-04-09 VITALS — BP 93/53 | HR 68 | Temp 98.3°F | Resp 20 | Ht 66.0 in | Wt 178.7 lb

## 2022-04-09 DIAGNOSIS — I83813 Varicose veins of bilateral lower extremities with pain: Secondary | ICD-10-CM | POA: Insufficient documentation

## 2022-04-09 NOTE — Progress Notes (Signed)
Requested by:  Coral Spikes, DO [place],  [place] [id number]  Reason for consultation: bilateral varicose veins with swelling    History of Present Illness   Ashley Ray is a 51 y.o. (27-Aug-1970) female who presents for evaluation of bilateral varicose veins with leg swelling.  She states that she has had varicose veins with bilateral leg swelling for at least 8 years.  She used to not notice it as much prior to her gastric surgery in 2015, however is gotten worse over the years.  She endorses aching, heavy, tired legs by the end of the day with the worst swelling in the evening.  Her swelling will extend from mid thigh to ankles bilaterally.  She also experiences burning and throbbing pain at the site of her varicose veins on both of her legs.  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  Aggravating factors include sitting or standing for long periods of time.  She has not tried compression or elevation before.  She has not had any previous vein procedures or history of DVT.  Both her father and paternal grandmother have a history of DVT, and her father at one point required bilateral iliac vein filters.  She denies any claudication, rest pain, wounds of the lower extremities.  She has not experienced any bleeding or ulceration events.  Past Medical History:  Diagnosis Date   Abnormal Pap smear of cervix    History of gastric surgery 07/25/2018   Gastric sleeve Dr Ebony Hail [facility] 2015   History of trichomoniasis 12/10/2016   Hypothyroid 02/08/2015   Pain in shoulder region 07/05/2015   Thyroid disease    Trichimoniasis    Vulvar abscess 07/05/2015    Past Surgical History:  Procedure Laterality Date   bariatric sleeve     KNEE SURGERY Bilateral    TONSILLECTOMY AND ADENOIDECTOMY      Social History   Socioeconomic History   Marital status: Married    Spouse name: Not  on file   Number of children: 1   Years of education: Not on file   Highest education level: Not on file  Occupational History   Not on file  Tobacco Use   Smoking status: Former    Years: 15.00    Types: Cigarettes    Passive exposure: Never   Smokeless tobacco: Never  Vaping Use   Vaping Use: Never used  Substance and Sexual Activity   Alcohol use: No   Drug use: No   Sexual activity: Yes    Birth control/protection: Surgical    Comment: vasectomy  Other Topics Concern   Not on file  Social History Narrative   Not on file   Social Determinants of Health   Financial Resource Strain: Low Risk  (04/27/2021)   Overall Financial Resource Strain (CARDIA)    Difficulty of Paying Living Expenses: Not hard at all  Food Insecurity: No Food Insecurity (04/27/2021)   Hunger Vital Sign    Worried About Running Out of Food in the Last Year: Never true    Ran Out of Food in the Last Year: Never true  Transportation Needs: No Transportation Needs (04/27/2021)   PRAPARE - Hydrologist (Medical): No    Lack of Transportation (Non-Medical): No  Physical Activity: Inactive (04/27/2021)   Exercise Vital Sign    Days of Exercise  per Week: 0 days    Minutes of Exercise per Session: 0 min  Stress: No Stress Concern Present (04/27/2021)   [place]    Feeling of Stress : Not at all  Social Connections: Socially Integrated (04/27/2021)   Social Connection and Isolation Panel [NHANES]    Frequency of Communication with Friends and Family: More than three times a week    Frequency of Social Gatherings with Friends and Family: More than three times a week    Attends Religious Services: 1 to 4 times per year    Active Member of [employer] or Organizations: Yes    Attends Music therapist: More than 4 times per year    Marital Status: Married  Human resources officer Violence: Not At Risk (04/27/2021)    Humiliation, Afraid, Rape, and Kick questionnaire    Fear of Current or Ex-Partner: No    Emotionally Abused: No    Physically Abused: No    Sexually Abused: No    Family History  Problem Relation Age of Onset   Cancer Paternal Grandfather    Heart disease Paternal Grandmother    Heart disease Maternal Grandmother    Cancer Father        throat   Deep vein thrombosis Father    Heart attack Father    Heart disease Father    Diabetes Father    Clotting disorder Father    Cancer Mother        cervical   Scleroderma Mother    Hypertension Mother    Raynaud syndrome Mother    Other Mother        esophagus disease    Current Outpatient Medications  Medication Sig Dispense Refill   amoxicillin-clavulanate (AUGMENTIN) 875-125 MG tablet Take 1 tablet by mouth 2 (two) times daily. 14 tablet 0   fluconazole (DIFLUCAN) 150 MG tablet Take 1 now and 1 in 3 days if needed 2 tablet 1   levocetirizine (XYZAL) 5 MG tablet Take 1 tablet (5 mg total) by mouth every evening. 30 tablet 2   levothyroxine (SYNTHROID) 25 MCG tablet TAKE 1 TABLET BY MOUTH ONCE DAILY BEFORE BREAKFAST 90 tablet 0   promethazine-dextromethorphan (PROMETHAZINE-DM) 6.25-15 MG/5ML syrup Take 5 mLs by mouth 4 (four) times daily as needed for cough. 118 mL 0   traZODone (DESYREL) 50 MG tablet Take 1 tablet (50 mg total) by mouth at bedtime. 30 tablet 30   valACYclovir (VALTREX) 1000 MG tablet Take 2 at time of cold sore and 2 the next day 20 tablet 3   No current facility-administered medications for this visit.    Allergies  Allergen Reactions   Topamax [Topiramate] Hives and Itching   Povidone Iodine Rash    REVIEW OF SYSTEMS (negative unless checked):   Cardiac:  []  Chest pain or chest pressure? []  Shortness of breath upon activity? []  Shortness of breath when lying flat? []  Irregular heart rhythm?  Vascular:  [x]  Pain in calf, thigh, or hip brought on by walking? []  Pain in feet at night that wakes you up  from your sleep? []  Blood clot in your veins? [x]  Leg swelling?  Pulmonary:  []  Oxygen at home? []  Productive cough? []  Wheezing?  Neurologic:  []  Sudden weakness in arms or legs? []  Sudden numbness in arms or legs? []  Sudden onset of difficult speaking or slurred speech? []  Temporary loss of vision in one eye? []  Problems with dizziness?  Gastrointestinal:  []  Blood in  stool? []  Vomited blood?  Genitourinary:  []  Burning when urinating? []  Blood in urine?  Psychiatric:  []  Major depression  Hematologic:  []  Bleeding problems? []  Problems with blood clotting?  Dermatologic:  []  Rashes or ulcers?  Constitutional:  []  Fever or chills?  Ear/Nose/Throat:  []  Change in hearing? []  Nose bleeds? []  Sore throat?  Musculoskeletal:  []  Back pain? []  Joint pain? []  Muscle pain?   Physical Examination     Vitals:   04/09/22 1217  BP: (!) 93/53  Pulse: 68  Resp: 20  Temp: 98.3 F (36.8 C)  TempSrc: Temporal  SpO2: 100%  Weight: 178 lb 11.2 oz (81.1 kg)  Height: 5\' 6"  (1.676 m)   Body mass index is 28.84 kg/m.  General:  WDWN in NAD; vital signs documented above Gait: Not observed HENT: WNL, normocephalic Pulmonary: normal non-labored breathing , without Rales, rhonchi,  wheezing Cardiac: regular HR, without murmurs without carotid bruit Abdomen: soft, NT, no masses Skin: without rashes Vascular Exam/Pulses: Palpable DP pulses bilaterally Extremities: with varicose veins, with reticular veins, with edema, without stasis pigmentation, without lipodermatosclerosis, without ulcers Musculoskeletal: no muscle wasting or atrophy  Neurologic: A&O X 3;  No focal weakness or paresthesias are detected Psychiatric:  The pt has Normal affect.  Non-invasive Vascular Imaging   LLE Venous Insufficiency Duplex (04/09/2022):   +--------------+---------+------+----------+------------+------------------  ----+  LEFT          Reflux NoReflux  Reflux  Diameter  cmsComments                                          Yes     Time                                         +--------------+---------+------+----------+------------+------------------  ----+  CFV           no                                                             +--------------+---------+------+----------+------------+------------------  ----+  FV mid        no                                                             +--------------+---------+------+----------+------------+------------------  ----+  Popliteal               yes  >1 second                                       +--------------+---------+------+----------+------------+------------------  ----+  GSV at SFJ              yes   >500 ms      0.60                              +--------------+---------+------+----------+------------+------------------  ----+  GSV prox thighno                           0.50    branch courses  lateral  +--------------+---------+------+----------+------------+------------------  ----+  GSV mid thigh no                           0.53    branch courses  medial   +--------------+---------+------+----------+------------+------------------  ----+  GSV dist thighno                           0.37                              +--------------+---------+------+----------+------------+------------------  ----+  GSV at knee   no                           0.37                              +--------------+---------+------+----------+------------+------------------  ----+  GSV prox calf no                           0.47                              +--------------+---------+------+----------+------------+------------------  ----+  SSV Pop Fossa no                           0.16                              +--------------+---------+------+----------+------------+------------------  ----+   Medical Decision Making    NIKHITA JEWKES is a 51 y.o. female who presents with: BLE chronic venous insufficiency, bilateral varicose veins with pain  Based on the patient's duplex study, the patient has reflux in her popliteal vein and greater saphenous vein at the saphenofemoral junction.  The rest of her GSV is competent.  The patient has large, painful varicose veins along her lateral left thigh and on her left calf.  She also has 1 large varicose vein across her anterior right lower extremity.  I discussed with the patient the use of her 20-30 mm thigh high compression stockings and need for 3 month trial of such. The patient will follow up in 3 months with one of our doctors to discuss any possible interventions on her varicose veins. She is happy with this plan She has palpable DP pulses bilaterally   Kenyen Candy Emmit Alexanders, PA-C [facility]: [phone]  04/09/2022, 1:27 PM  Clinic MD: Trula Slade

## 2022-05-26 IMAGING — MG DIGITAL DIAGNOSTIC BILAT W/ TOMO W/ CAD
6 of 10 series · 6 of 30 positions shown · non-contrast
Comparison: Previous exam(s).

CLINICAL DATA: 50-year-old female recalled from screening mammogram
dated 08/02/2021 for possible bilateral masses and asymmetry. The
patient was recently on estrogen therapy.



[R ML synth-2D]
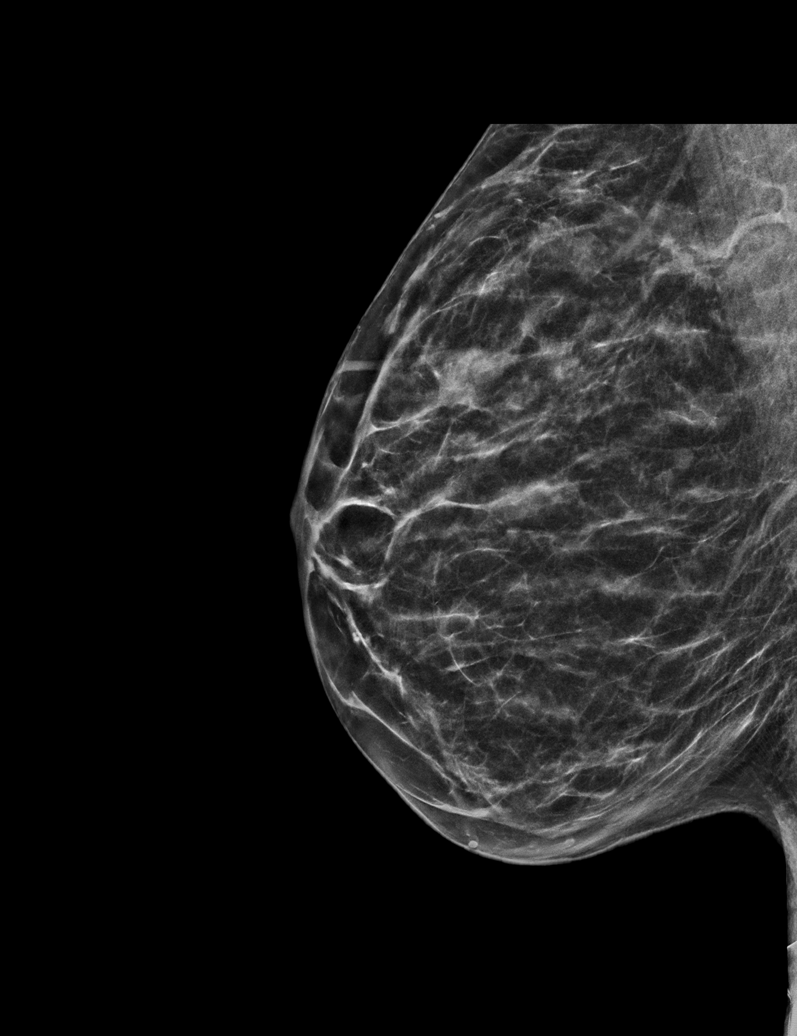

[L MLO synth-2D]
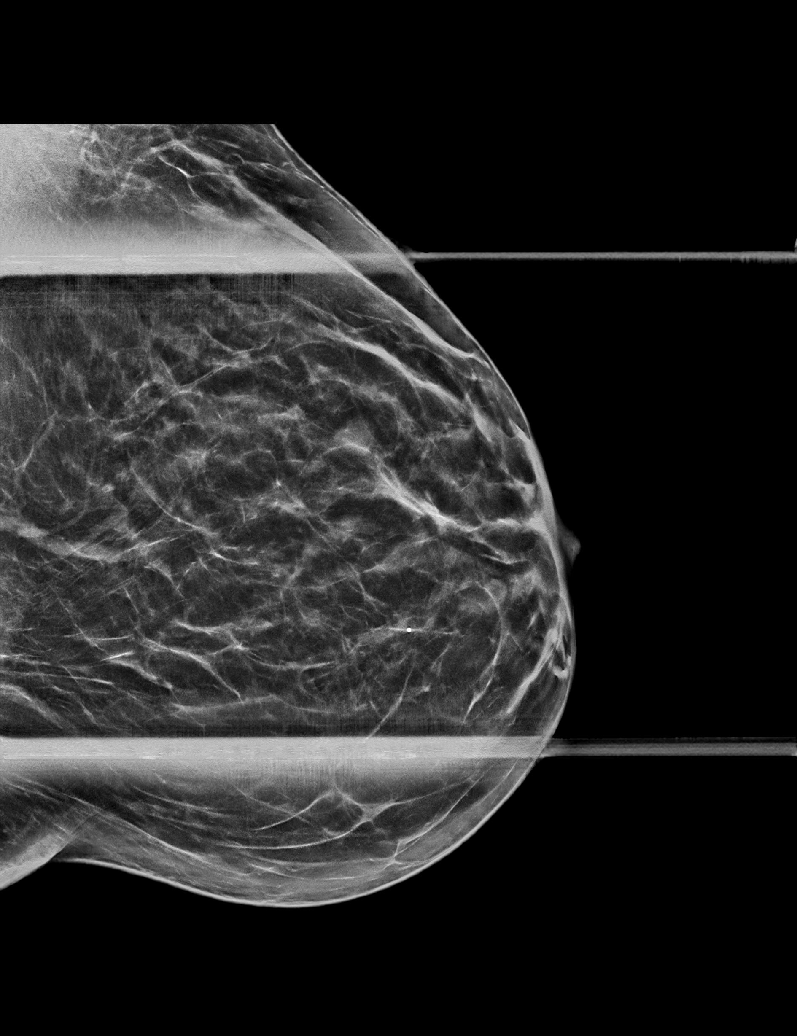

[L CC synth-2D]
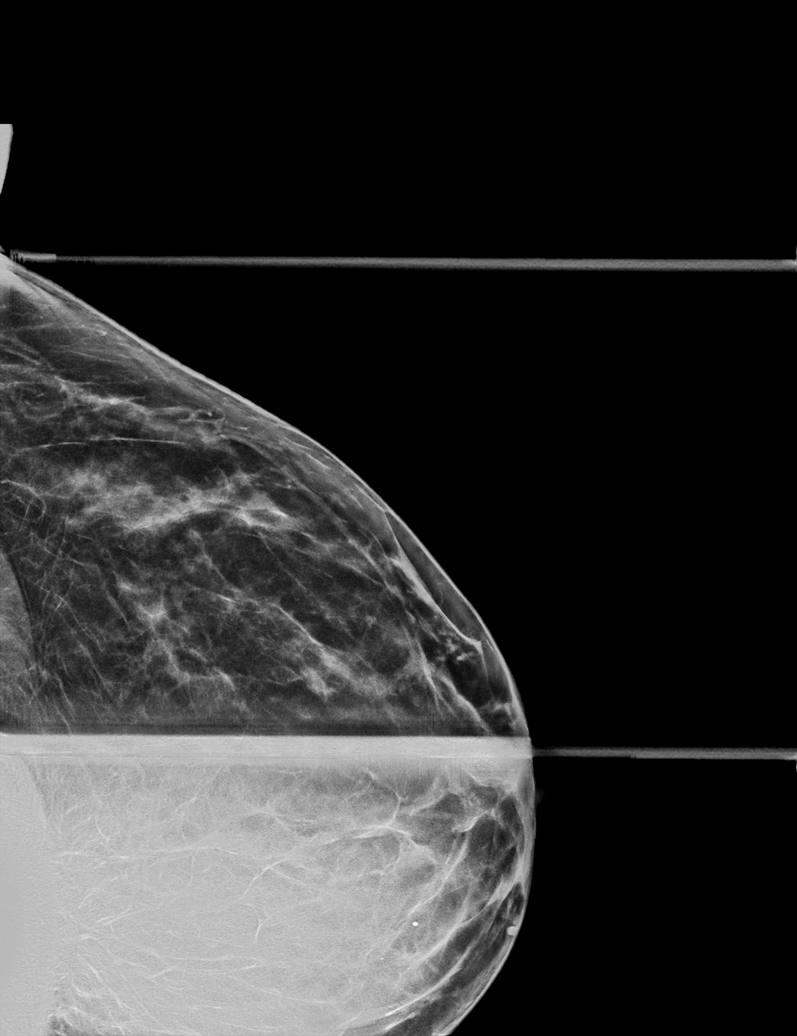

[R MLO synth-2D]
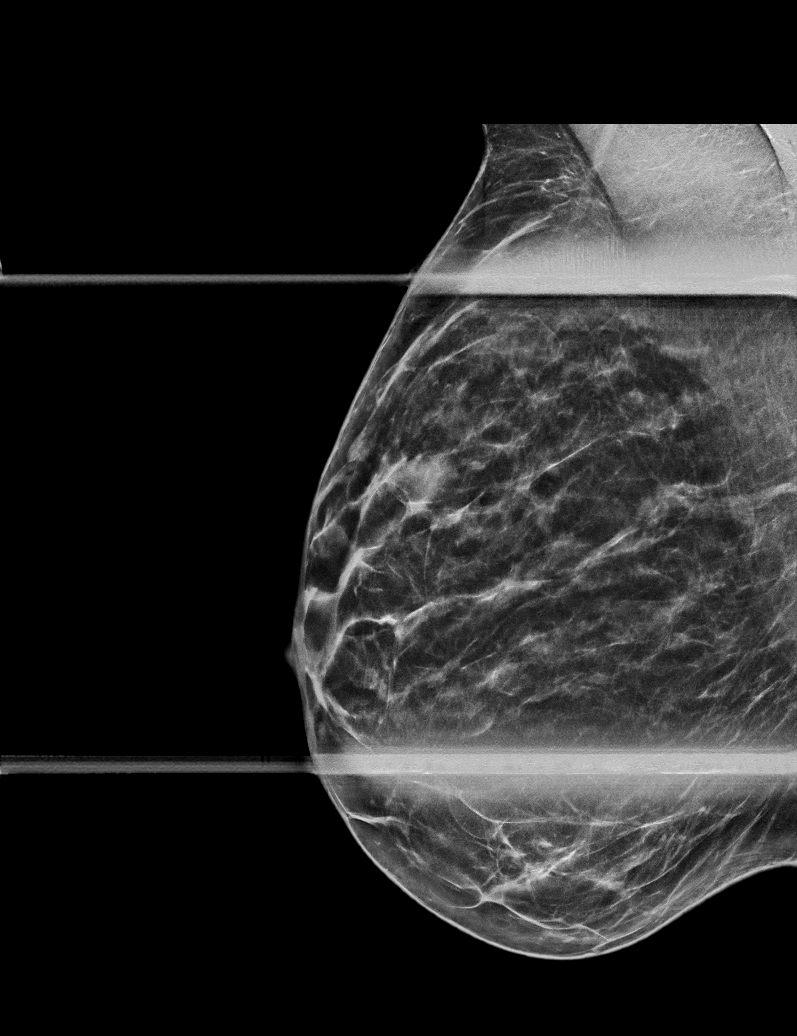

[R CC synth-2D]
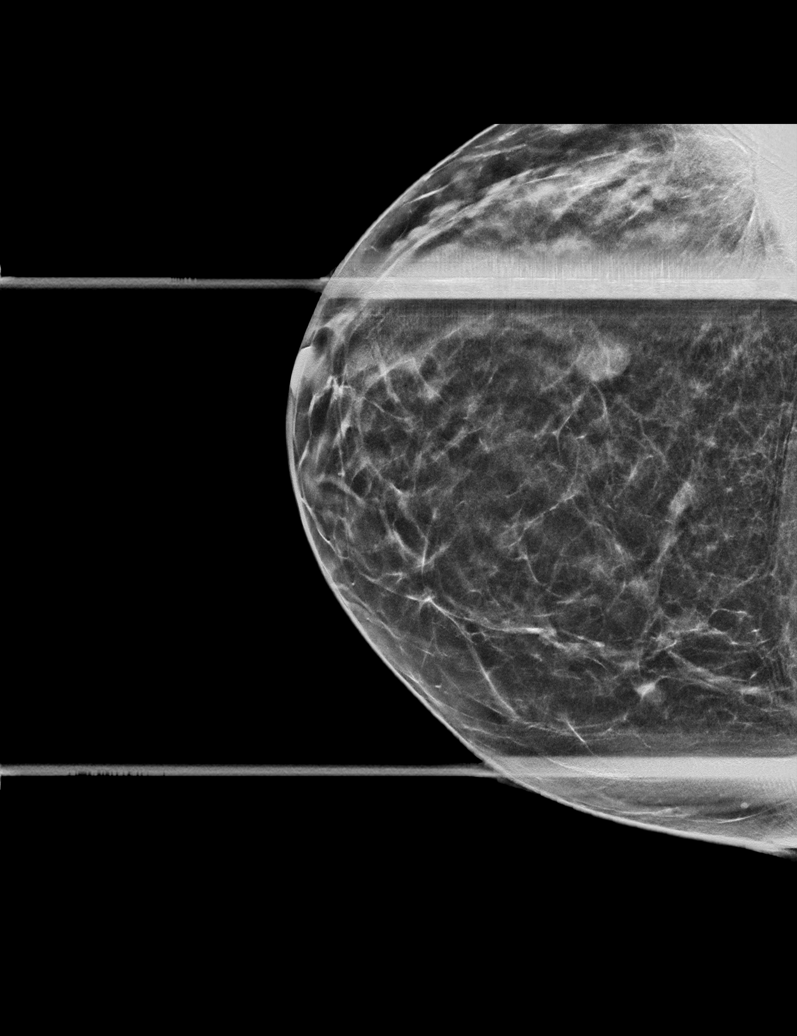

[R ML tomo · tomo slice 24/47.0]
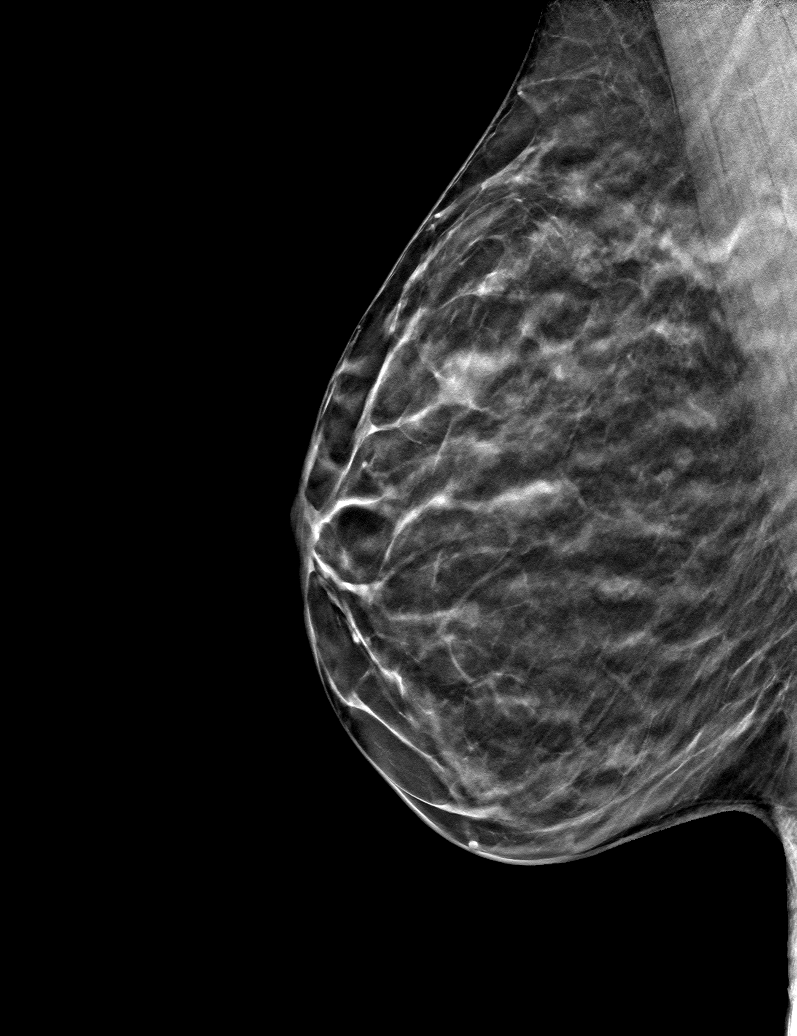

[6 of 30 positions shown; findings below may reference images not displayed]

ACR Breast Density Category c: The breast tissue is heterogeneously
dense, which may obscure small masses.
FINDINGS: Persistent oval, circumscribed equal density masses are noted in the
upper outer right breast and lateral left breast. An asymmetry in
the medial right breast on the CC projection partially effaces on
additional views.

Targeted ultrasound is performed, showing oval, circumscribed
anechoic cysts in the bilateral breasts. This includes a 1.3 x 0.7 x
1.1 cm cyst at the 10 o'clock position 3 cm from the nipple on the
right, a 8 x 8 x 6 mm cyst at the 3 o'clock position 4 cm from the
nipple on the left and a 5 x 5 x 4 mm cyst at the 5 o'clock position
1 cm from the nipple on the left. These correspond with the
mammographically identified masses. A benign cluster of cysts is
demonstrated at the 1 o'clock position 3 cm on the right, measuring
1.1 x 0.3 x 0.4 cm. This likely corresponds with the mammographic
asymmetry.
IMPRESSION: Benign fibrocystic changes bilaterally corresponding with the
screening mammographic findings. No further follow-up required.

RECOMMENDATION:
Screening mammogram in one year.(Code:[id number]-[id number])

I have discussed the findings and recommendations with the patient.
If applicable, a reminder letter will be sent to the patient
regarding the next appointment.

BI-RADS CATEGORY  2: Benign.

## 2022-06-25 ENCOUNTER — Encounter: Payer: Self-pay | Admitting: Family Medicine

## 2022-07-04 ENCOUNTER — Encounter: Payer: Self-pay | Admitting: Vascular Surgery

## 2022-07-04 ENCOUNTER — Ambulatory Visit (INDEPENDENT_AMBULATORY_CARE_PROVIDER_SITE_OTHER): Payer: BC Managed Care – PPO | Admitting: Vascular Surgery

## 2022-07-04 VITALS — BP 122/59 | HR 66 | Temp 98.3°F | Resp 14 | Ht 65.0 in | Wt 180.0 lb

## 2022-07-04 DIAGNOSIS — I872 Venous insufficiency (chronic) (peripheral): Secondary | ICD-10-CM | POA: Diagnosis not present

## 2022-07-04 DIAGNOSIS — I890 Lymphedema, not elsewhere classified: Secondary | ICD-10-CM | POA: Diagnosis not present

## 2022-07-04 DIAGNOSIS — I83813 Varicose veins of bilateral lower extremities with pain: Secondary | ICD-10-CM | POA: Diagnosis not present

## 2022-07-04 NOTE — Progress Notes (Signed)
Test   REASON FOR VISIT:   Follow-up of bilateral lower extremity swelling  MEDICAL ISSUES:   BILATERAL LOWER EXTREMITY SWELLING: This patient does have some deep venous reflux in the popliteal vein on the left based on her previous duplex scan.  However based on her physical exam I think the swelling is most likely secondary to lymphedema.  I do not think reflux in the popliteal vein on the left would explain the swelling.  She had no significant superficial venous reflux.  Based on her history, it sounds like this began at a young age.  We have discussed the importance of intermittent leg elevation and the proper positioning for this.  We have also fitted her for some knee-high compression stockings.  I encouraged her to avoid prolonged sitting and standing.  We discussed the importance of exercise specifically walking and water aerobics.  She is done an excellent job of losing weight after her surgery.  I think if her swelling progresses then she would be a candidate for a lymphedema pump.  Currently however she does not have the time to use it for 2 hours a day.  She will call if her swelling progresses and we will be happy to reevaluate her at any time.  However, again I think the swelling is secondary to lymphedema and to a lesser degree her venous insufficiency.   HPI:   Ashley Ray is a pleasant 51 y.o. female who was seen by Kayren Eaves, PA on 04/09/2022 with varicose veins of both lower extremities and swelling.  She been having issues for at least 8 years.  Of note she works about 70 hours a week and is on her feet quite a bit.  Her symptoms are aggravated by sitting and standing.  They are relieved with elevation.  Results of her venous reflux study are discussed below.  She was prescribed thigh-high compression stockings with a gradient of 20 to 30 mmHg, encouraged to elevate her legs, and exercise.  She comes in for 71-month follow-up visit.  Since we saw her last, she  continues to have swelling in both legs.  She had been wearing her thigh-high stockings but these tend to roll down.  They did help her symptoms some.  She describes some aching pain and heaviness in her legs after working long hours.  Her symptoms are relieved with elevation.  She has a hospital bed which she can position with her legs elevated.  She has had previous gastric banding.  She denies any other abdominal surgery or radiation therapy to her abdomen and groins which would potentially be a source of lymphedema.  She states that she has had swelling in her legs since she was fairly young.  Past Medical History:  Diagnosis Date   Abnormal Pap smear of cervix    History of gastric surgery 07/25/2018   Gastric sleeve Dr Link Snuffer [place] 2015   History of trichomoniasis 12/10/2016   Hypothyroid 02/08/2015   Pain in shoulder region 07/05/2015   Thyroid disease    Trichimoniasis    Vulvar abscess 07/05/2015    Family History  Problem Relation Age of Onset   Cancer Paternal Grandfather    Heart disease Paternal Grandmother    Heart disease Maternal Grandmother    Cancer Father        throat   Deep vein thrombosis Father    Heart attack Father    Heart disease Father    Diabetes Father    Clotting  disorder Father    Cancer Mother        cervical   Scleroderma Mother    Hypertension Mother    Raynaud syndrome Mother    Other Mother        esophagus disease    SOCIAL HISTORY: Social History   Tobacco Use   Smoking status: Former    Years: 15.00    Types: Cigarettes    Passive exposure: Never   Smokeless tobacco: Never  Substance Use Topics   Alcohol use: No    Allergies  Allergen Reactions   Topamax [Topiramate] Hives and Itching   Povidone Iodine Rash    Current Outpatient Medications  Medication Sig Dispense Refill   levothyroxine (SYNTHROID) 25 MCG tablet TAKE 1 TABLET BY MOUTH ONCE DAILY BEFORE BREAKFAST 90 tablet 0   traZODone (DESYREL) 50 MG  tablet Take 1 tablet (50 mg total) by mouth at bedtime. 30 tablet 30   valACYclovir (VALTREX) 1000 MG tablet Take 2 at time of cold sore and 2 the next day 20 tablet 3   amoxicillin-clavulanate (AUGMENTIN) 875-125 MG tablet Take 1 tablet by mouth 2 (two) times daily. (Patient not taking: Reported on 07/04/2022) 14 tablet 0   fluconazole (DIFLUCAN) 150 MG tablet Take 1 now and 1 in 3 days if needed (Patient not taking: Reported on 07/04/2022) 2 tablet 1   levocetirizine (XYZAL) 5 MG tablet Take 1 tablet (5 mg total) by mouth every evening. (Patient not taking: Reported on 07/04/2022) 30 tablet 2   promethazine-dextromethorphan (PROMETHAZINE-DM) 6.25-15 MG/5ML syrup Take 5 mLs by mouth 4 (four) times daily as needed for cough. (Patient not taking: Reported on 07/04/2022) 118 mL 0   No current facility-administered medications for this visit.    REVIEW OF SYSTEMS:  [X]  denotes positive finding, [ ]  denotes negative finding Cardiac  Comments:  Chest pain or chest pressure:    Shortness of breath upon exertion:    Short of breath when lying flat:    Irregular heart rhythm:        Vascular    Pain in calf, thigh, or hip brought on by ambulation:    Pain in feet at night that wakes you up from your sleep:     Blood clot in your veins:    Leg swelling:  x       Pulmonary    Oxygen at home:    Productive cough:     Wheezing:         Neurologic    Sudden weakness in arms or legs:     Sudden numbness in arms or legs:     Sudden onset of difficulty speaking or slurred speech:    Temporary loss of vision in one eye:     Problems with dizziness:         Gastrointestinal    Blood in stool:     Vomited blood:         Genitourinary    Burning when urinating:     Blood in urine:        Psychiatric    Major depression:         Hematologic    Bleeding problems:    Problems with blood clotting too easily:        Skin    Rashes or ulcers:        Constitutional    Fever or chills:      PHYSICAL EXAM:   Vitals:   07/04/22 1445  BP: )  122/59  Pulse: 66  Resp: 14  Temp: 98.3 F (36.8 C)  TempSrc: Temporal  SpO2: 100%  Weight: 180 lb (81.6 kg)  Height: 5\' 5"  (1.651 m)   Body mass index is 29.95 kg/m.  GENERAL: The patient is a well-nourished female, in no acute distress. The vital signs are documented above. CARDIAC: There is a regular rate and rhythm.  VASCULAR: I do not detect carotid bruits. She has palpable pedal pulses bilaterally. She has nonpitting edema of both lower extremities consistent with lymphedema. She has some small telangiectasias and spider veins bilaterally. PULMONARY: There is good air exchange bilaterally without wheezing or rales. ABDOMEN: Soft and non-tender with normal pitched bowel sounds.  MUSCULOSKELETAL: There are no major deformities or cyanosis. NEUROLOGIC: No focal weakness or paresthesias are detected. SKIN: There are no ulcers or rashes noted. PSYCHIATRIC: The patient has a normal affect.  DATA:    VENOUS DUPLEX: I have reviewed the venous duplex scan that was done on 04/09/2022.  This was of the left lower extremity only.  There was no evidence of DVT.  There was deep venous reflux in the popliteal vein.  There was no significant superficial venous reflux.  There was reflux at the saphenofemoral junction only.  A total of 40 minutes was spent on this visit. 20 minutes was face to face time. More than 50% of the time was spent on counseling and coordinating with the patient.    04/11/2022 [facility] [phone]

## 2022-07-11 ENCOUNTER — Other Ambulatory Visit: Payer: Self-pay | Admitting: Family Medicine

## 2022-07-11 DIAGNOSIS — E039 Hypothyroidism, unspecified: Secondary | ICD-10-CM

## 2022-07-12 MED ORDER — LEVOTHYROXINE SODIUM 25 MCG PO TABS: 25.0000 ug | ORAL_TABLET | Freq: Every day | ORAL | 0 refills | Status: DC

## 2022-07-12 MED ORDER — PROMETHAZINE-DM 6.25-15 MG/5ML PO SYRP: 5.0000 mL | ORAL_SOLUTION | Freq: Four times a day (QID) | ORAL | 0 refills | Status: DC | PRN

## 2022-07-13 ENCOUNTER — Other Ambulatory Visit: Payer: Self-pay | Admitting: Family Medicine

## 2022-07-13 DIAGNOSIS — G8929 Other chronic pain: Secondary | ICD-10-CM

## 2022-08-30 ENCOUNTER — Encounter: Payer: Self-pay | Admitting: *Deleted

## 2022-09-21 ENCOUNTER — Other Ambulatory Visit: Payer: Self-pay | Admitting: Family Medicine

## 2022-09-21 DIAGNOSIS — Z1231 Encounter for screening mammogram for malignant neoplasm of breast: Secondary | ICD-10-CM

## 2022-09-24 ENCOUNTER — Other Ambulatory Visit: Payer: Self-pay | Admitting: Adult Health

## 2022-10-04 ENCOUNTER — Other Ambulatory Visit: Payer: Self-pay | Admitting: Family Medicine

## 2022-10-04 DIAGNOSIS — E039 Hypothyroidism, unspecified: Secondary | ICD-10-CM

## 2022-12-18 ENCOUNTER — Ambulatory Visit (INDEPENDENT_AMBULATORY_CARE_PROVIDER_SITE_OTHER): Payer: BC Managed Care – PPO | Admitting: Family Medicine

## 2022-12-18 ENCOUNTER — Encounter: Payer: Self-pay | Admitting: Family Medicine

## 2022-12-18 VITALS — BP 110/73 | HR 70 | Ht 65.0 in | Wt 180.4 lb

## 2022-12-18 DIAGNOSIS — Z130 Encounter for screening for diseases of the blood and blood-forming organs and certain disorders involving the immune mechanism: Secondary | ICD-10-CM

## 2022-12-18 DIAGNOSIS — Z0000 Encounter for general adult medical examination without abnormal findings: Secondary | ICD-10-CM

## 2022-12-18 DIAGNOSIS — R739 Hyperglycemia, unspecified: Secondary | ICD-10-CM

## 2022-12-18 DIAGNOSIS — E039 Hypothyroidism, unspecified: Secondary | ICD-10-CM | POA: Diagnosis not present

## 2022-12-18 DIAGNOSIS — Z13220 Encounter for screening for lipoid disorders: Secondary | ICD-10-CM | POA: Diagnosis not present

## 2022-12-18 DIAGNOSIS — Z0001 Encounter for general adult medical examination with abnormal findings: Secondary | ICD-10-CM

## 2022-12-18 NOTE — Assessment & Plan Note (Signed)
Doing well.  Labs today.  Declines HIV and hepatitis C screening.  Declines vaccines today.

## 2022-12-18 NOTE — Progress Notes (Signed)
Subjective:  Patient ID: Ashley Ray, female    DOB: 04-Feb-1971  Age: 52 y.o. MRN: [id number]  CC: Chief Complaint  Patient presents with   Annual Exam    HPI:  52 year old female presents for an annual exam.  Patient is in need of screening labs.  She states that she is doing well.  She has no complaints or concerns at this time.  She states that she needs a physical for her work/insurance.  Patient declines HIV and hepatitis C screening.  She has an upcoming mammogram on the 31st.  Declines shingles vaccine.  Unsure of last tetanus.  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   Papanicolaou smear of cervix with positive high risk human papilloma virus (HPV) test 02/08/2020   History of gastric surgery 07/25/2018    Social Hx   Social History   Socioeconomic History   Marital status: Married    Spouse name: Not on file   Number of children: 1   Years of education: Not on file   Highest education level: Not on file  Occupational History   Not on file  Tobacco Use   Smoking status: Former    Years: 15    Types: Cigarettes    Passive exposure: Never   Smokeless tobacco: Never  Vaping Use   Vaping Use: Never used  Substance and Sexual Activity   Alcohol use: No   Drug use: No   Sexual activity: Yes    Birth control/protection: Surgical    Comment: vasectomy  Other Topics Concern   Not on file  Social History Narrative   Not on file   Social Determinants of Health   Financial Resource Strain: Low Risk  (04/27/2021)   Overall Financial Resource Strain (CARDIA)    Difficulty of Paying Living Expenses: Not hard at all  Food Insecurity: No Food Insecurity (04/27/2021)   Hunger Vital Sign    Worried About Running Out of Food in the Last Year: Never true    Ran Out of Food in the Last  Year: Never true  Transportation Needs: No Transportation Needs (04/27/2021)   PRAPARE - Administrator, Civil Service (Medical): No    Lack of Transportation (Non-Medical): No  Physical Activity: Inactive (04/27/2021)   Exercise Vital Sign    Days of Exercise per Week: 0 days    Minutes of Exercise per Session: 0 min  Stress: No Stress Concern Present (04/27/2021)   [employer] of Occupational Health - Occupational Stress Questionnaire    Feeling of Stress : Not at all  Social Connections: Socially Integrated (04/27/2021)   Social Connection and Isolation Panel [NHANES]    Frequency of Communication with Friends and Family: More than three times a week    Frequency of Social Gatherings with Friends and Family: More than three times a week    Attends Religious Services: 1 to 4 times per year    Active Member of [employer] or Organizations: Yes    Attends Engineer, structural: More than 4 times per year    Marital Status: Married    Review of Systems  Constitutional: Negative.   Respiratory: Negative.    Cardiovascular: Negative.      Objective:  BP 110/73   Pulse 70   Ht 5\' 5"  (1.651 m)  Wt 180 lb 6.4 oz (81.8 kg)   SpO2 98%   BMI 30.02 kg/m      12/18/2022    9:03 AM 07/04/2022    2:45 PM 04/09/2022   12:17 PM  BP/Weight  Systolic BP 110 122 93  Diastolic BP 73 59 53  Wt. (Lbs) 180.4 180 178.7  BMI 30.02 kg/m2 29.95 kg/m2 28.84 kg/m2    Physical Exam Vitals and nursing note reviewed.  Constitutional:      General: She is not in acute distress.    Appearance: Normal appearance.  HENT:     Head: Normocephalic and atraumatic.  Eyes:     General:        Right eye: No discharge.        Left eye: No discharge.     Conjunctiva/sclera: Conjunctivae normal.  Cardiovascular:     Rate and Rhythm: Normal rate and regular rhythm.  Pulmonary:     Effort: Pulmonary effort is normal.     Breath sounds: Normal breath sounds. No wheezing, rhonchi or  rales.  Neurological:     Mental Status: She is alert.  Psychiatric:        Mood and Affect: Mood normal.        Behavior: Behavior normal.     Lab Results  Component Value Date   WBC 10.7 10/10/2021   HGB 13.9 10/10/2021   HCT 41.2 10/10/2021   PLT 246 10/10/2021   GLUCOSE 127 (H) 10/10/2021   CHOL 188 10/10/2021   TRIG 81 10/10/2021   HDL 92 10/10/2021   LDLCALC 81 10/10/2021   ALT 15 10/10/2021   AST 11 10/10/2021   NA 136 10/10/2021   K 4.2 10/10/2021   CL 102 10/10/2021   CREATININE 0.73 10/10/2021   BUN 26 (H) 10/10/2021   CO2 20 10/10/2021   TSH 2.740 12/25/2021     Assessment & Plan:   Problem List Items Addressed This Visit       Endocrine   Hypothyroidism   Relevant Orders   TSH + free T4   T3, Free     Other   Annual physical exam - Primary    Doing well.  Labs today.  Declines HIV and hepatitis C screening.  Declines vaccines today.      Other Visit Diagnoses     Screening for deficiency anemia       Relevant Orders   CBC   Screening, lipid       Relevant Orders   Lipid panel   Blood glucose elevated       Relevant Orders   CMP14+EGFR   Hemoglobin A1c       Follow-up: Annually  Everlene Other DO [facility]

## 2022-12-18 NOTE — Patient Instructions (Signed)
Labs today.  Continue your current medications.  Follow up annually.  Take care  Dr. Adriana Simas

## 2022-12-20 ENCOUNTER — Encounter: Payer: Self-pay | Admitting: Family Medicine

## 2022-12-20 ENCOUNTER — Encounter: Payer: Self-pay | Admitting: Vascular Surgery

## 2022-12-21 ENCOUNTER — Ambulatory Visit
Admission: RE | Admit: 2022-12-21 | Discharge: 2022-12-21 | Disposition: A | Discharge status: Home / Self Care | Payer: BC Managed Care – PPO | Source: Ambulatory Visit | Attending: Family Medicine | Admitting: Family Medicine

## 2022-12-21 DIAGNOSIS — Z1231 Encounter for screening mammogram for malignant neoplasm of breast: Secondary | ICD-10-CM

## 2022-12-26 ENCOUNTER — Other Ambulatory Visit: Payer: Self-pay | Admitting: Family Medicine

## 2022-12-26 DIAGNOSIS — R928 Other abnormal and inconclusive findings on diagnostic imaging of breast: Secondary | ICD-10-CM

## 2023-01-05 LAB — CMP14+EGFR
ALT: 27 IU/L (ref 0–32)
AST: 21 IU/L (ref 0–40)
Albumin/Globulin Ratio: 1.9
Albumin: 4.3 g/dL (ref 3.8–4.9)
Alkaline Phosphatase: 55 IU/L (ref 44–121)
BUN/Creatinine Ratio: 24 — ABNORMAL HIGH (ref 9–23)
BUN: 19 mg/dL (ref 6–24)
Bilirubin Total: 0.6 mg/dL (ref 0.0–1.2)
CO2: 24 mmol/L (ref 20–29)
Calcium: 9.7 mg/dL (ref 8.7–10.2)
Chloride: 104 mmol/L (ref 96–106)
Creatinine, Ser: 0.78 mg/dL (ref 0.57–1.00)
Globulin, Total: 2.3 g/dL (ref 1.5–4.5)
Glucose: 92 mg/dL (ref 70–99)
Potassium: 4.9 mmol/L (ref 3.5–5.2)
Sodium: 138 mmol/L (ref 134–144)
Total Protein: 6.6 g/dL (ref 6.0–8.5)
eGFR: 92 mL/min/{1.73_m2} (ref >59)

## 2023-01-05 LAB — CBC
Hematocrit: 41.2 % (ref 34.0–46.6)
Hemoglobin: 13.5 g/dL (ref 11.1–15.9)
MCH: 29.8 pg (ref 26.6–33.0)
MCHC: 32.8 g/dL (ref 31.5–35.7)
MCV: 91 fL (ref 79–97)
Platelets: 210 10*3/uL (ref 150–450)
RBC: 4.53 x10E6/uL (ref 3.77–5.28)
RDW: 12.5 % (ref 11.7–15.4)
WBC: 4.4 10*3/uL (ref 3.4–10.8)

## 2023-01-05 LAB — LIPID PANEL
Chol/HDL Ratio: 1.8 ratio (ref 0.0–4.4)
Cholesterol, Total: 156 mg/dL (ref 100–199)
HDL: 85 mg/dL (ref >39)
LDL Chol Calc (NIH): 58 mg/dL (ref 0–99)
Triglycerides: 68 mg/dL (ref 0–149)
VLDL Cholesterol Cal: 13 mg/dL (ref 5–40)

## 2023-01-05 LAB — HEMOGLOBIN A1C
Est. average glucose Bld gHb Est-mCnc: 114 mg/dL
Hgb A1c MFr Bld: 5.6 % (ref 4.8–5.6)

## 2023-01-05 LAB — T3, FREE: T3, Free: 2.9 pg/mL (ref 2.0–4.4)

## 2023-01-05 LAB — TSH+FREE T4
Free T4: 1.18 ng/dL (ref 0.82–1.77)
TSH: 3.41 u[IU]/mL (ref 0.450–4.500)

## 2023-01-07 ENCOUNTER — Other Ambulatory Visit: Payer: Self-pay | Admitting: Family Medicine

## 2023-01-07 ENCOUNTER — Ambulatory Visit: Payer: BC Managed Care – PPO | Admitting: Adult Health

## 2023-01-07 ENCOUNTER — Other Ambulatory Visit (HOSPITAL_COMMUNITY)
Admission: RE | Admit: 2023-01-07 | Discharge: 2023-01-07 | Disposition: A | Discharge status: Home / Self Care | Payer: BC Managed Care – PPO | Source: Ambulatory Visit | Attending: Adult Health | Admitting: Adult Health

## 2023-01-07 ENCOUNTER — Other Ambulatory Visit: Payer: BC Managed Care – PPO

## 2023-01-07 ENCOUNTER — Encounter: Payer: Self-pay | Admitting: Adult Health

## 2023-01-07 VITALS — BP 114/74 | HR 59 | Ht 65.0 in | Wt 181.5 lb

## 2023-01-07 DIAGNOSIS — B009 Herpesviral infection, unspecified: Secondary | ICD-10-CM

## 2023-01-07 DIAGNOSIS — K649 Unspecified hemorrhoids: Secondary | ICD-10-CM

## 2023-01-07 DIAGNOSIS — N951 Menopausal and female climacteric states: Secondary | ICD-10-CM

## 2023-01-07 DIAGNOSIS — E039 Hypothyroidism, unspecified: Secondary | ICD-10-CM

## 2023-01-07 DIAGNOSIS — Z124 Encounter for screening for malignant neoplasm of cervix: Secondary | ICD-10-CM | POA: Insufficient documentation

## 2023-01-07 DIAGNOSIS — Z01419 Encounter for gynecological examination (general) (routine) without abnormal findings: Secondary | ICD-10-CM

## 2023-01-07 DIAGNOSIS — Z1331 Encounter for screening for depression: Secondary | ICD-10-CM | POA: Diagnosis not present

## 2023-01-07 DIAGNOSIS — B001 Herpesviral vesicular dermatitis: Secondary | ICD-10-CM

## 2023-01-07 DIAGNOSIS — Z1211 Encounter for screening for malignant neoplasm of colon: Secondary | ICD-10-CM

## 2023-01-07 DIAGNOSIS — G479 Sleep disorder, unspecified: Secondary | ICD-10-CM

## 2023-01-07 LAB — HEMOCCULT GUIAC POC 1CARD (OFFICE): Fecal Occult Blood, POC: NEGATIVE

## 2023-01-07 MED ORDER — VALACYCLOVIR HCL 1 G PO TABS
ORAL_TABLET | ORAL | 3 refills | Status: AC
Start: 2023-01-07 — End: ?

## 2023-01-07 MED ORDER — TRAZODONE HCL 50 MG PO TABS: 50.0000 mg | ORAL_TABLET | Freq: Every day | ORAL | 12 refills | Status: DC

## 2023-01-07 MED ORDER — HYDROCORTISONE ACETATE 25 MG RE SUPP: 25.0000 mg | Freq: Two times a day (BID) | RECTAL | 3 refills | Status: DC | PRN

## 2023-01-07 NOTE — Progress Notes (Signed)
Patient ID: Ashley Ray, female   DOB: June 09, 1971, 52 y.o.   MRN: [id number] History of Present Illness: Ashley Ray is a 52 year old white female, married, G1P1001, in for pap and breast exam, she had physical 12/18/22 with Dr Adriana Simas. She has decreased libido, irregular periods and a hemorrhoid.   PCP is Dr Adriana Simas.   Current Medications, Allergies, Past Medical History, Past Surgical History, Family History and Social History were reviewed in [employer] record.     Review of Systems: Patient denies any headaches, hearing loss, fatigue, blurred vision, shortness of breath, chest pain, abdominal pain, problems with bowel movements, urination, or intercourse. No joint pain or mood swings.  See HPI for positives.  Physical Exam:BP 114/74 (BP Location: Left Arm, Patient Position: Sitting, Cuff Size: Normal)   Pulse (!) 59   Ht 5\' 5"  (1.651 m)   Wt 181 lb 8 oz (82.3 kg)   LMP 12/14/2022   BMI 30.20 kg/m   General:  Well developed, well nourished, no acute distress Skin:  Warm and dry, left eye lid on bottom red, no stye noted Lungs; Clear to auscultation bilaterally Breast:  No dominant palpable mass, retraction, or nipple discharge Cardiovascular: Regular rate and rhythm Abdomen:  Soft, non tender, no HSM  Pelvic:  External genitalia is normal in appearance, no lesions.  The vagina is pale. Urethra has no lesions or masses. The cervix is smooth, pap with HR HPV genotyping performed.  Uterus is felt to be normal size, shape, and contour.  No adnexal masses or tenderness noted.Bladder is non tender, no masses felt. Rectal: Good sphincter tone, no polyps, + hemorrhoids felt.  Hemoccult negative. Extremities/musculoskeletal:  No swelling,+ varicosities noted, no clubbing or cyanosis Psych:  No mood changes, alert and cooperative,seems happy AA [place] 1 Fall risk is low    01/07/2023   10:35 AM 12/18/2022    9:06 AM 12/25/2021    8:59 AM  Depression screen PHQ 2/9   Decreased Interest 0 0 0  Down, Depressed, Hopeless 0 0 0  PHQ - 2 Score 0 0 0  Altered sleeping 0    Tired, decreased energy 0    Change in appetite 0    Feeling bad or failure about yourself  0    Trouble concentrating 0    Moving slowly or fidgety/restless 0    Suicidal thoughts 0    PHQ-9 Score 0         01/07/2023   10:36 AM 04/27/2021    3:03 PM 02/08/2020    9:45 AM  GAD 7 : Generalized Anxiety Score  Nervous, Anxious, on Edge 0 0 0  Control/stop worrying 0 0 0  Worry too much - different things 0 0 0  Trouble relaxing 0 0 0  Restless 0 0 0  Easily annoyed or irritable 0 0 0  Afraid - awful might happen 0 0 0  Total GAD 7 Score 0 0 0      Upstream - 01/07/23 1035       Pregnancy Intention Screening   Does the patient want to become pregnant in the next year? N/A    Does the patient's partner want to become pregnant in the next year? N/A    Would the patient like to discuss contraceptive options today? N/A      Contraception Wrap Up   Current Method Vasectomy    End Method Vasectomy    Contraception Counseling Provided No  Examination chaperoned by Malachy Mood LPN   Impression and Plan: 1. Routine Papanicolaou smear Pap sent Pap in 3 years if normal - Cytology - PAP( [place])  2. Encounter for gynecological examination with Papanicolaou smear of cervix Pap sent Physical and labs with PCP GYN exam in 1 year Mammogram 01/18/23 in follow up ?asymmetry 12/21/22 Cologuard due 2025  3. Encounter for screening fecal occult blood testing Hemoccult was negative  - POCT occult blood stool  4. Hemorrhoids, unspecified hemorrhoid type +hemorrhoid, at 5 0'clock Will rx anusol HS supp  5. Perimenopause Increase frequency of sex Discussed periods will get more irregular before stopping   6. Recurrent cold sores Will refill valtrex  7. Herpes simplex Refilled valtrex - valACYclovir (VALTREX) 1000 MG tablet; Take 2 at time of cold sore and  2 the next day  Dispense: 20 tablet; Refill: 3  8. Sleep disturbance Refilled trazodone 50 mg 1 at HS  Meds ordered this encounter  Medications   traZODone (DESYREL) 50 MG tablet    Sig: Take 1 tablet (50 mg total) by mouth at bedtime.    Dispense:  30 tablet    Refill:  12    Order Specific Question:   Supervising Provider    Answer:   Duane Lope H [[id number]]   valACYclovir (VALTREX) 1000 MG tablet    Sig: Take 2 at time of cold sore and 2 the next day    Dispense:  20 tablet    Refill:  3    Order Specific Question:   Supervising Provider    Answer:   Lazaro Arms [[id number]]   hydrocortisone (ANUSOL-HC) 25 MG suppository    Sig: Place 1 suppository (25 mg total) rectally 2 (two) times daily as needed for hemorrhoids or anal itching.    Dispense:  12 suppository    Refill:  3    Order Specific Question:   Supervising Provider    Answer:   Duane Lope H [[id number]]

## 2023-01-08 LAB — CYTOLOGY - PAP
Adequacy: ABSENT
Comment: NEGATIVE
Diagnosis: NEGATIVE
High risk HPV: NEGATIVE

## 2023-01-08 MED ORDER — LEVOTHYROXINE SODIUM 25 MCG PO TABS
25.0000 ug | ORAL_TABLET | Freq: Every day | ORAL | 0 refills | Status: DC
Start: 2023-01-08 — End: 2023-04-15

## 2023-01-18 ENCOUNTER — Ambulatory Visit
Admission: RE | Admit: 2023-01-18 | Discharge: 2023-01-18 | Disposition: A | Discharge status: Home / Self Care | Payer: BC Managed Care – PPO | Source: Ambulatory Visit | Attending: Family Medicine | Admitting: Family Medicine

## 2023-01-18 DIAGNOSIS — R928 Other abnormal and inconclusive findings on diagnostic imaging of breast: Secondary | ICD-10-CM

## 2023-04-01 ENCOUNTER — Ambulatory Visit: Payer: Self-pay | Admitting: Adult Health

## 2023-04-15 ENCOUNTER — Other Ambulatory Visit: Payer: Self-pay | Admitting: Family Medicine

## 2023-04-15 DIAGNOSIS — E039 Hypothyroidism, unspecified: Secondary | ICD-10-CM

## 2023-05-09 DIAGNOSIS — M1711 Unilateral primary osteoarthritis, right knee: Secondary | ICD-10-CM | POA: Diagnosis not present

## 2023-05-09 DIAGNOSIS — M7591 Shoulder lesion, unspecified, right shoulder: Secondary | ICD-10-CM | POA: Diagnosis not present

## 2023-05-09 DIAGNOSIS — M75101 Unspecified rotator cuff tear or rupture of right shoulder, not specified as traumatic: Secondary | ICD-10-CM | POA: Diagnosis not present

## 2023-05-09 DIAGNOSIS — M25561 Pain in right knee: Secondary | ICD-10-CM | POA: Diagnosis not present

## 2023-05-30 DIAGNOSIS — M1711 Unilateral primary osteoarthritis, right knee: Secondary | ICD-10-CM | POA: Diagnosis not present

## 2023-05-30 DIAGNOSIS — M75101 Unspecified rotator cuff tear or rupture of right shoulder, not specified as traumatic: Secondary | ICD-10-CM | POA: Diagnosis not present

## 2023-06-03 DIAGNOSIS — M2669 Other specified disorders of temporomandibular joint: Secondary | ICD-10-CM | POA: Diagnosis not present

## 2023-06-03 DIAGNOSIS — R293 Abnormal posture: Secondary | ICD-10-CM | POA: Diagnosis not present

## 2023-06-13 DIAGNOSIS — M2669 Other specified disorders of temporomandibular joint: Secondary | ICD-10-CM | POA: Diagnosis not present

## 2023-06-13 DIAGNOSIS — R293 Abnormal posture: Secondary | ICD-10-CM | POA: Diagnosis not present

## 2023-06-17 DIAGNOSIS — R293 Abnormal posture: Secondary | ICD-10-CM | POA: Diagnosis not present

## 2023-06-17 DIAGNOSIS — M2669 Other specified disorders of temporomandibular joint: Secondary | ICD-10-CM | POA: Diagnosis not present

## 2023-06-18 ENCOUNTER — Ambulatory Visit: Payer: BC Managed Care – PPO | Admitting: Adult Health

## 2023-06-18 ENCOUNTER — Encounter: Payer: Self-pay | Admitting: Adult Health

## 2023-06-18 VITALS — BP 100/65 | HR 67 | Ht 66.0 in | Wt 198.5 lb

## 2023-06-18 DIAGNOSIS — G43109 Migraine with aura, not intractable, without status migrainosus: Secondary | ICD-10-CM | POA: Diagnosis not present

## 2023-06-18 DIAGNOSIS — N951 Menopausal and female climacteric states: Secondary | ICD-10-CM

## 2023-06-18 DIAGNOSIS — R635 Abnormal weight gain: Secondary | ICD-10-CM | POA: Insufficient documentation

## 2023-06-18 DIAGNOSIS — R5383 Other fatigue: Secondary | ICD-10-CM | POA: Insufficient documentation

## 2023-06-18 NOTE — Progress Notes (Signed)
  Subjective:     Patient ID: Ashley Ray, female   DOB: 1971-01-29, 52 y.o.   MRN: [id number]  HPI Ashley Ray is a 52 year old white female, married, G1P1001 in complaining of more headaches and weight gain, wonders about menopause.     Component Value Date/Time   DIAGPAP  01/07/2023 1059    - Negative for intraepithelial lesion or malignancy (NILM)   DIAGPAP  04/27/2021 1559    - Negative for intraepithelial lesion or malignancy (NILM)   DIAGPAP  02/08/2020 0937    - Negative for intraepithelial lesion or malignancy (NILM)   HPVHIGH Negative 01/07/2023 1059   HPVHIGH Negative 04/27/2021 1559   HPVHIGH Negative 02/08/2020 0937   ADEQPAP  01/07/2023 1059    Satisfactory for evaluation; transformation zone component ABSENT.   ADEQPAP  04/27/2021 1559    Satisfactory for evaluation; transformation zone component PRESENT.   ADEQPAP  02/08/2020 0937    Satisfactory for evaluation; transformation zone component PRESENT.   PCP is Dr Adriana Simas  Review of Systems Periods regular +tired at end of the day Has gained weight Having more headaches Has swelling BLE Reviewed past medical,surgical, social and family history. Reviewed medications and allergies.     Objective:   Physical Exam BP 100/65 (BP Location: Left Arm, Patient Position: Sitting, Cuff Size: Normal)   Pulse 67   Ht 5\' 6"  (1.676 m)   Wt 198 lb 8 oz (90 kg)   LMP 05/27/2023   BMI 32.04 kg/m     Skin warm and dry. Lungs: clear to ausculation bilaterally. Cardiovascular: regular rate and rhythm. Has swelling BLE, wear socks stop Fall risk is low  Upstream - 06/18/23 1444       Pregnancy Intention Screening   Does the patient want to become pregnant in the next year? N/A    Does the patient's partner want to become pregnant in the next year? N/A    Would the patient like to discuss contraceptive options today? N/A      Contraception Wrap Up   Current Method Vasectomy    End Method Vasectomy    Contraception  Counseling Provided No             Assessment:     1. Perimenopause Periods still monthly Occasional Hot flash,  - Follicle stimulating hormone Review handouts on perimenopause and menopause She wants FSH checked, will check but discussed this can fluctuate   2. Migraine with aura and without status migrainosus, not intractable Having more headaches and last one had aura  3. Weight gain Has gained weight but in less active job now Will check thyroid labs  - TSH + free T4  4. Tired Feels tired at end of the day - TSH + free T4     Plan:     Follow up prn

## 2023-06-19 LAB — TSH+FREE T4
Free T4: 1.15 ng/dL (ref 0.82–1.77)
TSH: 3 u[IU]/mL (ref 0.450–4.500)

## 2023-06-19 LAB — FOLLICLE STIMULATING HORMONE: FSH: 1.9 m[IU]/mL

## 2023-06-25 DIAGNOSIS — R293 Abnormal posture: Secondary | ICD-10-CM | POA: Diagnosis not present

## 2023-06-25 DIAGNOSIS — M2669 Other specified disorders of temporomandibular joint: Secondary | ICD-10-CM | POA: Diagnosis not present

## 2023-07-01 DIAGNOSIS — M2669 Other specified disorders of temporomandibular joint: Secondary | ICD-10-CM | POA: Diagnosis not present

## 2023-07-01 DIAGNOSIS — R293 Abnormal posture: Secondary | ICD-10-CM | POA: Diagnosis not present

## 2023-07-07 ENCOUNTER — Other Ambulatory Visit: Payer: Self-pay | Admitting: Family Medicine

## 2023-07-07 DIAGNOSIS — E039 Hypothyroidism, unspecified: Secondary | ICD-10-CM

## 2023-07-08 MED ORDER — LEVOTHYROXINE SODIUM 25 MCG PO TABS
25.0000 ug | ORAL_TABLET | Freq: Every day | ORAL | 0 refills | Status: DC
Start: 2023-07-08 — End: 2023-09-25

## 2023-07-09 ENCOUNTER — Other Ambulatory Visit: Payer: Self-pay | Admitting: Family Medicine

## 2023-07-09 DIAGNOSIS — E039 Hypothyroidism, unspecified: Secondary | ICD-10-CM

## 2023-07-10 DIAGNOSIS — E66811 Obesity, class 1: Secondary | ICD-10-CM | POA: Diagnosis not present

## 2023-07-10 DIAGNOSIS — Z903 Acquired absence of stomach [part of]: Secondary | ICD-10-CM | POA: Diagnosis not present

## 2023-07-11 DIAGNOSIS — R293 Abnormal posture: Secondary | ICD-10-CM | POA: Diagnosis not present

## 2023-07-11 DIAGNOSIS — M2669 Other specified disorders of temporomandibular joint: Secondary | ICD-10-CM | POA: Diagnosis not present

## 2023-07-15 DIAGNOSIS — R293 Abnormal posture: Secondary | ICD-10-CM | POA: Diagnosis not present

## 2023-07-15 DIAGNOSIS — M2669 Other specified disorders of temporomandibular joint: Secondary | ICD-10-CM | POA: Diagnosis not present

## 2023-07-25 DIAGNOSIS — R293 Abnormal posture: Secondary | ICD-10-CM | POA: Diagnosis not present

## 2023-07-25 DIAGNOSIS — M2669 Other specified disorders of temporomandibular joint: Secondary | ICD-10-CM | POA: Diagnosis not present

## 2023-07-29 DIAGNOSIS — M2669 Other specified disorders of temporomandibular joint: Secondary | ICD-10-CM | POA: Diagnosis not present

## 2023-07-29 DIAGNOSIS — R293 Abnormal posture: Secondary | ICD-10-CM | POA: Diagnosis not present

## 2023-08-12 DIAGNOSIS — M2669 Other specified disorders of temporomandibular joint: Secondary | ICD-10-CM | POA: Diagnosis not present

## 2023-08-12 DIAGNOSIS — R293 Abnormal posture: Secondary | ICD-10-CM | POA: Diagnosis not present

## 2023-08-19 DIAGNOSIS — R293 Abnormal posture: Secondary | ICD-10-CM | POA: Diagnosis not present

## 2023-08-19 DIAGNOSIS — M2669 Other specified disorders of temporomandibular joint: Secondary | ICD-10-CM | POA: Diagnosis not present

## 2023-09-09 DIAGNOSIS — R293 Abnormal posture: Secondary | ICD-10-CM | POA: Diagnosis not present

## 2023-09-09 DIAGNOSIS — M25511 Pain in right shoulder: Secondary | ICD-10-CM | POA: Diagnosis not present

## 2023-09-19 DIAGNOSIS — R293 Abnormal posture: Secondary | ICD-10-CM | POA: Diagnosis not present

## 2023-09-19 DIAGNOSIS — M25511 Pain in right shoulder: Secondary | ICD-10-CM | POA: Diagnosis not present

## 2023-09-24 ENCOUNTER — Ambulatory Visit: Payer: BC Managed Care – PPO | Admitting: Adult Health

## 2023-09-24 ENCOUNTER — Encounter: Payer: Self-pay | Admitting: Adult Health

## 2023-09-24 VITALS — BP 115/71 | HR 62 | Ht 66.0 in | Wt 191.0 lb

## 2023-09-24 DIAGNOSIS — N921 Excessive and frequent menstruation with irregular cycle: Secondary | ICD-10-CM | POA: Diagnosis not present

## 2023-09-24 DIAGNOSIS — R102 Pelvic and perineal pain unspecified side: Secondary | ICD-10-CM | POA: Insufficient documentation

## 2023-09-24 NOTE — Progress Notes (Signed)
  Subjective:     Patient ID: Ashley Ray, female   DOB: [birth year], 53 y.o.   MRN: [id number]  HPI Idaliz is a 53 year old white female, amrried, G1P1001, in complaining about having heavy long period with cramping. Period was normal in January, spotted 08/24/23, had sex and bleeding started 08/28/23 and was heavy with clots and cramps, spotting now. She has started [facility] about 3 months ago.     Component Value Date/Time   DIAGPAP  01/07/2023 1059    - Negative for intraepithelial lesion or malignancy (NILM)   DIAGPAP  04/27/2021 1559    - Negative for intraepithelial lesion or malignancy (NILM)   DIAGPAP  02/08/2020 0937    - Negative for intraepithelial lesion or malignancy (NILM)   HPVHIGH Negative 01/07/2023 1059   HPVHIGH Negative 04/27/2021 1559   HPVHIGH Negative 02/08/2020 0937   ADEQPAP  01/07/2023 1059    Satisfactory for evaluation; transformation zone component ABSENT.   ADEQPAP  04/27/2021 1559    Satisfactory for evaluation; transformation zone component PRESENT.   ADEQPAP  02/08/2020 0937    Satisfactory for evaluation; transformation zone component PRESENT.    PCP is Dr Adriana Simas.  Review of Systems +complaining about having heavy long period with cramping. Period was normal in January, spotted 08/24/23, had sex and bleeding started 08/28/23 and was heavy with clots and cramps, spotting now.  Reviewed past medical,surgical, social and family history. Reviewed medications and allergies.     Objective:   Physical Exam BP 115/71 (BP Location: Left Arm, Patient Position: Sitting, Cuff Size: Normal)   Pulse 62   Ht 5\' 6"  (1.676 m)   Wt 191 lb (86.6 kg)   BMI 30.83 kg/m     Skin warm and dry.Pelvic: external genitalia is normal in appearance no lesions, vagina: pink,urethra has no lesions or masses noted, cervix:smooth and bulbous, uterus: normal size, shape and contour, non tender, no masses felt, adnexa: no masses or tenderness noted. Bladder is non tender and no  masses felt. Fall risk is low  Upstream - 09/24/23 1606       Pregnancy Intention Screening   Does the patient want to become pregnant in the next year? No    Does the patient's partner want to become pregnant in the next year? No    Would the patient like to discuss contraceptive options today? No      Contraception Wrap Up   Current Method Vasectomy    End Method Vasectomy    Contraception Counseling Provided No            Examination chaperoned by Marylynn Pearson NP student  Assessment:     1. Menorrhagia with irregular cycle (Primary) +complaining about having heavy long period with cramping. Period was normal in January, spotted 08/24/23, had sex and bleeding started 08/28/23 and was heavy with clots and cramps, spotting now.  Return about 10/15/23 for pelvic US in office to assess uterus and ovaries  - US PELVIC COMPLETE WITH TRANSVAGINAL; Future  2. Pelvic cramping +cramps  - US PELVIC COMPLETE WITH TRANSVAGINAL; Future     Plan:     Follow up in 3 weeks for pelvic US

## 2023-09-25 ENCOUNTER — Other Ambulatory Visit: Payer: Self-pay | Admitting: Family Medicine

## 2023-09-25 DIAGNOSIS — E039 Hypothyroidism, unspecified: Secondary | ICD-10-CM

## 2023-09-25 MED ORDER — LEVOTHYROXINE SODIUM 25 MCG PO TABS
25.0000 ug | ORAL_TABLET | Freq: Every day | ORAL | 0 refills | Status: AC
Start: 2023-09-25 — End: ?

## 2023-10-06 ENCOUNTER — Other Ambulatory Visit: Payer: Self-pay | Admitting: Family Medicine

## 2023-10-06 DIAGNOSIS — E039 Hypothyroidism, unspecified: Secondary | ICD-10-CM

## 2023-10-16 ENCOUNTER — Encounter: Payer: Self-pay | Admitting: Adult Health

## 2023-10-16 ENCOUNTER — Ambulatory Visit (INDEPENDENT_AMBULATORY_CARE_PROVIDER_SITE_OTHER): Admitting: Radiology

## 2023-10-16 DIAGNOSIS — N921 Excessive and frequent menstruation with irregular cycle: Secondary | ICD-10-CM

## 2023-10-16 DIAGNOSIS — K219 Gastro-esophageal reflux disease without esophagitis: Secondary | ICD-10-CM | POA: Diagnosis not present

## 2023-10-16 DIAGNOSIS — E66811 Obesity, class 1: Secondary | ICD-10-CM | POA: Diagnosis not present

## 2023-10-16 DIAGNOSIS — Z903 Acquired absence of stomach [part of]: Secondary | ICD-10-CM | POA: Diagnosis not present

## 2023-10-16 DIAGNOSIS — R102 Pelvic and perineal pain: Secondary | ICD-10-CM

## 2023-10-16 NOTE — Progress Notes (Signed)
 GYN [place]:  TA and TV imaging performed - Chaperone: Quila - vinyl probe cover used  Anteverted uterus normal in size.  There are multiple small intramural fibroids seen within posterior myometrium. One fibroid is partially submucosal measuring 10 x 10 mm seen protruding slightly into mid posterior cavity.  All fibroids <= 10 mm Endometrial thickness = 7.3 mm,  avascular cavity and canal. Single avascular 3 mm cystic focus within lower endometrial cavity,  No evidence of other intracavitary soft tissue defects.   The ovaries are nl in size and seen with multiple follicles all <= 17 mm. There is one tiny avascular cyst within Left ovary = 12 x 9 mm with diffuse low level echoes throughout.  Thin smoothly marginated walls, ? small L ov endometrioma - neg adnexal regions Neg CDS - no Free Fluid

## 2023-10-28 DIAGNOSIS — M7591 Shoulder lesion, unspecified, right shoulder: Secondary | ICD-10-CM | POA: Diagnosis not present

## 2023-10-28 DIAGNOSIS — M75101 Unspecified rotator cuff tear or rupture of right shoulder, not specified as traumatic: Secondary | ICD-10-CM | POA: Diagnosis not present

## 2023-10-28 DIAGNOSIS — M25511 Pain in right shoulder: Secondary | ICD-10-CM | POA: Diagnosis not present

## 2023-10-28 DIAGNOSIS — M249 Joint derangement, unspecified: Secondary | ICD-10-CM | POA: Diagnosis not present

## 2023-11-05 ENCOUNTER — Other Ambulatory Visit: Payer: Self-pay | Admitting: Women's Health

## 2023-11-05 MED ORDER — FLUCONAZOLE 150 MG PO TABS: 150.0000 mg | ORAL_TABLET | Freq: Once | ORAL | 0 refills | Status: AC

## 2023-11-25 DIAGNOSIS — M7521 Bicipital tendinitis, right shoulder: Secondary | ICD-10-CM | POA: Diagnosis not present

## 2023-11-25 DIAGNOSIS — M249 Joint derangement, unspecified: Secondary | ICD-10-CM | POA: Diagnosis not present

## 2023-11-25 DIAGNOSIS — G8929 Other chronic pain: Secondary | ICD-10-CM | POA: Diagnosis not present

## 2023-11-25 DIAGNOSIS — M25511 Pain in right shoulder: Secondary | ICD-10-CM | POA: Diagnosis not present

## 2023-12-23 DIAGNOSIS — M25511 Pain in right shoulder: Secondary | ICD-10-CM | POA: Diagnosis not present

## 2023-12-23 DIAGNOSIS — M6281 Muscle weakness (generalized): Secondary | ICD-10-CM | POA: Diagnosis not present

## 2023-12-23 DIAGNOSIS — M799 Soft tissue disorder, unspecified: Secondary | ICD-10-CM | POA: Diagnosis not present

## 2023-12-30 DIAGNOSIS — M799 Soft tissue disorder, unspecified: Secondary | ICD-10-CM | POA: Diagnosis not present

## 2023-12-30 DIAGNOSIS — M6281 Muscle weakness (generalized): Secondary | ICD-10-CM | POA: Diagnosis not present

## 2023-12-30 DIAGNOSIS — M25511 Pain in right shoulder: Secondary | ICD-10-CM | POA: Diagnosis not present

## 2024-01-08 ENCOUNTER — Encounter: Payer: Self-pay | Admitting: Family Medicine

## 2024-01-08 DIAGNOSIS — E039 Hypothyroidism, unspecified: Secondary | ICD-10-CM

## 2024-01-08 MED ORDER — LEVOTHYROXINE SODIUM 25 MCG PO TABS
25.0000 ug | ORAL_TABLET | Freq: Every day | ORAL | 0 refills | Status: DC
Start: 2024-01-08 — End: 2024-04-07

## 2024-01-15 ENCOUNTER — Other Ambulatory Visit: Payer: Self-pay | Admitting: Adult Health

## 2024-01-16 DIAGNOSIS — K219 Gastro-esophageal reflux disease without esophagitis: Secondary | ICD-10-CM | POA: Diagnosis not present

## 2024-01-16 DIAGNOSIS — Z903 Acquired absence of stomach [part of]: Secondary | ICD-10-CM | POA: Diagnosis not present

## 2024-01-16 DIAGNOSIS — Z6828 Body mass index (BMI) 28.0-28.9, adult: Secondary | ICD-10-CM | POA: Diagnosis not present

## 2024-01-16 DIAGNOSIS — E66811 Obesity, class 1: Secondary | ICD-10-CM | POA: Diagnosis not present

## 2024-02-20 DIAGNOSIS — M25511 Pain in right shoulder: Secondary | ICD-10-CM | POA: Insufficient documentation

## 2024-02-20 DIAGNOSIS — M7501 Adhesive capsulitis of right shoulder: Secondary | ICD-10-CM | POA: Diagnosis not present

## 2024-03-02 DIAGNOSIS — M2569 Stiffness of other specified joint, not elsewhere classified: Secondary | ICD-10-CM | POA: Diagnosis not present

## 2024-03-02 DIAGNOSIS — M25511 Pain in right shoulder: Secondary | ICD-10-CM | POA: Diagnosis not present

## 2024-03-02 DIAGNOSIS — M6281 Muscle weakness (generalized): Secondary | ICD-10-CM | POA: Diagnosis not present

## 2024-03-04 DIAGNOSIS — M2569 Stiffness of other specified joint, not elsewhere classified: Secondary | ICD-10-CM | POA: Diagnosis not present

## 2024-03-04 DIAGNOSIS — M25511 Pain in right shoulder: Secondary | ICD-10-CM | POA: Diagnosis not present

## 2024-03-04 DIAGNOSIS — M6281 Muscle weakness (generalized): Secondary | ICD-10-CM | POA: Diagnosis not present

## 2024-03-09 ENCOUNTER — Other Ambulatory Visit: Payer: Self-pay | Admitting: Adult Health

## 2024-03-09 DIAGNOSIS — M25511 Pain in right shoulder: Secondary | ICD-10-CM | POA: Diagnosis not present

## 2024-03-09 DIAGNOSIS — M6281 Muscle weakness (generalized): Secondary | ICD-10-CM | POA: Diagnosis not present

## 2024-03-09 DIAGNOSIS — M2569 Stiffness of other specified joint, not elsewhere classified: Secondary | ICD-10-CM | POA: Diagnosis not present

## 2024-03-25 ENCOUNTER — Other Ambulatory Visit: Payer: Self-pay | Admitting: Family Medicine

## 2024-03-25 DIAGNOSIS — M2569 Stiffness of other specified joint, not elsewhere classified: Secondary | ICD-10-CM | POA: Diagnosis not present

## 2024-03-25 DIAGNOSIS — Z1231 Encounter for screening mammogram for malignant neoplasm of breast: Secondary | ICD-10-CM

## 2024-03-25 DIAGNOSIS — M25511 Pain in right shoulder: Secondary | ICD-10-CM | POA: Diagnosis not present

## 2024-03-25 DIAGNOSIS — M6281 Muscle weakness (generalized): Secondary | ICD-10-CM | POA: Diagnosis not present

## 2024-03-30 DIAGNOSIS — M25511 Pain in right shoulder: Secondary | ICD-10-CM | POA: Diagnosis not present

## 2024-03-30 DIAGNOSIS — M2569 Stiffness of other specified joint, not elsewhere classified: Secondary | ICD-10-CM | POA: Diagnosis not present

## 2024-03-30 DIAGNOSIS — M6281 Muscle weakness (generalized): Secondary | ICD-10-CM | POA: Diagnosis not present

## 2024-04-05 ENCOUNTER — Other Ambulatory Visit: Payer: Self-pay | Admitting: Family Medicine

## 2024-04-05 DIAGNOSIS — E039 Hypothyroidism, unspecified: Secondary | ICD-10-CM

## 2024-04-06 ENCOUNTER — Other Ambulatory Visit: Payer: Self-pay | Admitting: Adult Health

## 2024-04-06 DIAGNOSIS — M2569 Stiffness of other specified joint, not elsewhere classified: Secondary | ICD-10-CM | POA: Diagnosis not present

## 2024-04-06 DIAGNOSIS — M25511 Pain in right shoulder: Secondary | ICD-10-CM | POA: Diagnosis not present

## 2024-04-06 DIAGNOSIS — M6281 Muscle weakness (generalized): Secondary | ICD-10-CM | POA: Diagnosis not present

## 2024-04-08 DIAGNOSIS — M6281 Muscle weakness (generalized): Secondary | ICD-10-CM | POA: Diagnosis not present

## 2024-04-08 DIAGNOSIS — M2569 Stiffness of other specified joint, not elsewhere classified: Secondary | ICD-10-CM | POA: Diagnosis not present

## 2024-04-08 DIAGNOSIS — M25511 Pain in right shoulder: Secondary | ICD-10-CM | POA: Diagnosis not present

## 2024-04-16 DIAGNOSIS — K219 Gastro-esophageal reflux disease without esophagitis: Secondary | ICD-10-CM | POA: Diagnosis not present

## 2024-04-16 DIAGNOSIS — E66811 Obesity, class 1: Secondary | ICD-10-CM | POA: Diagnosis not present

## 2024-04-16 DIAGNOSIS — Z903 Acquired absence of stomach [part of]: Secondary | ICD-10-CM | POA: Diagnosis not present

## 2024-04-16 DIAGNOSIS — E785 Hyperlipidemia, unspecified: Secondary | ICD-10-CM | POA: Diagnosis not present

## 2024-04-20 DIAGNOSIS — M2569 Stiffness of other specified joint, not elsewhere classified: Secondary | ICD-10-CM | POA: Diagnosis not present

## 2024-04-20 DIAGNOSIS — M6281 Muscle weakness (generalized): Secondary | ICD-10-CM | POA: Diagnosis not present

## 2024-04-20 DIAGNOSIS — M25511 Pain in right shoulder: Secondary | ICD-10-CM | POA: Diagnosis not present

## 2024-04-22 ENCOUNTER — Encounter: Payer: Self-pay | Admitting: Family Medicine

## 2024-04-22 ENCOUNTER — Ambulatory Visit (INDEPENDENT_AMBULATORY_CARE_PROVIDER_SITE_OTHER): Admitting: Family Medicine

## 2024-04-22 VITALS — BP 102/69 | HR 72 | Ht 66.0 in | Wt 166.0 lb

## 2024-04-22 DIAGNOSIS — Z13220 Encounter for screening for lipoid disorders: Secondary | ICD-10-CM | POA: Diagnosis not present

## 2024-04-22 DIAGNOSIS — Z0000 Encounter for general adult medical examination without abnormal findings: Secondary | ICD-10-CM

## 2024-04-22 DIAGNOSIS — E039 Hypothyroidism, unspecified: Secondary | ICD-10-CM

## 2024-04-22 DIAGNOSIS — Z9889 Other specified postprocedural states: Secondary | ICD-10-CM

## 2024-04-22 MED ORDER — LEVOTHYROXINE SODIUM 25 MCG PO TABS
25.0000 ug | ORAL_TABLET | Freq: Every day | ORAL | 3 refills | Status: AC
Start: 2024-04-22 — End: ?

## 2024-04-22 NOTE — Assessment & Plan Note (Signed)
 Doing well.  Preventative health care discussed.  Labs today.  Follow-up annually.

## 2024-04-22 NOTE — Patient Instructions (Signed)
Labs today.  Follow up annually.  Take care  Dr. Raylen Tangonan  

## 2024-04-22 NOTE — Progress Notes (Signed)
 Subjective:  Patient ID: Ashley Ray, female    DOB: 1970/10/12  Age: 53 y.o. MRN: [id number]  CC:   Chief Complaint  Patient presents with   Annual Exam    HPI:  53 year old female presents for an annual exam.  She has an upcoming mammogram scheduled.  She declines flu vaccine today.  She desires her routine labs.  She needs labs specific for being status post bariatric surgery.  She is doing well at this time.  BMI is 26.79.  She is on Wegovy from bariatrics.  She denies chest pain or shortness of breath.  No vision changes.  No hearing loss.  She is doing well at this time.  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   History of gastric surgery 07/25/2018    Social Hx   Social History   Socioeconomic History   Marital status: Married    Spouse name: Not on file   Number of children: 1   Years of education: Not on file   Highest education level: Not on file  Occupational History   Not on file  Tobacco Use   Smoking status: Former    Types: Cigarettes    Passive exposure: Never   Smokeless tobacco: Never  Vaping Use   Vaping status: Never Used  Substance and Sexual Activity   Alcohol use: Yes    Comment: occ   Drug use: No   Sexual activity: Yes    Birth control/protection: Surgical    Comment: vasectomy  Other Topics Concern   Not on file  Social History Narrative   Not on file   Social Drivers of Health   Financial Resource Strain: Low Risk  (01/07/2023)   Overall Financial Resource Strain (CARDIA)    Difficulty of Paying Living Expenses: Not hard at all  Food Insecurity: No Food Insecurity (01/07/2023)   Hunger Vital Sign    Worried About Running Out of Food in the Last Year: Never true    Ran  Out of Food in the Last Year: Never true  Transportation Needs: No Transportation Needs (01/07/2023)   PRAPARE - Administrator, Civil Service (Medical): No    Lack of Transportation (Non-Medical): No  Physical Activity: Insufficiently Active (01/07/2023)   Exercise Vital Sign    Days of Exercise per Week: 1 day    Minutes of Exercise per Session: 30 min  Stress: No Stress Concern Present (01/07/2023)   [employer] of Occupational Health - Occupational Stress Questionnaire    Feeling of Stress : Not at all  Social Connections: Moderately Integrated (01/07/2023)   Social Connection and Isolation Panel    Frequency of Communication with Friends and Family: More than three times a week    Frequency of Social Gatherings with Friends and Family: Twice a week    Attends Religious Services: 1 to 4 times per year    Active Member of [employer] or Organizations: No    Attends Engineer, structural: Never    Marital Status: Married    Review of Systems Per HPI  Objective:  BP 102/69   Pulse 72   Ht 5' 6 (1.676 m)   Wt 166 lb (75.3 kg)   BMI 26.79 kg/m  04/22/2024    8:32 AM 09/24/2023    3:55 PM 06/18/2023    2:20 PM  BP/Weight  Systolic BP 102 115 100  Diastolic BP 69 71 65  Wt. (Lbs) 166 191 198.5  BMI 26.79 kg/m2 30.83 kg/m2 32.04 kg/m2    Physical Exam Vitals and nursing note reviewed.  Constitutional:      General: She is not in acute distress.    Appearance: Normal appearance.  HENT:     Head: Normocephalic and atraumatic.  Eyes:     General:        Right eye: No discharge.        Left eye: No discharge.     Conjunctiva/sclera: Conjunctivae normal.  Cardiovascular:     Rate and Rhythm: Normal rate and regular rhythm.  Pulmonary:     Effort: Pulmonary effort is normal.     Breath sounds: Normal breath sounds. No wheezing, rhonchi or rales.  Neurological:     Mental Status: She is alert.  Psychiatric:        Mood and Affect: Mood normal.         Behavior: Behavior normal.     Lab Results  Component Value Date   WBC 4.4 01/04/2023   HGB 13.5 01/04/2023   HCT 41.2 01/04/2023   PLT 210 01/04/2023   GLUCOSE 92 01/04/2023   CHOL 156 01/04/2023   TRIG 68 01/04/2023   HDL 85 01/04/2023   LDLCALC 58 01/04/2023   ALT 27 01/04/2023   AST 21 01/04/2023   NA 138 01/04/2023   K 4.9 01/04/2023   CL 104 01/04/2023   CREATININE 0.78 01/04/2023   BUN 19 01/04/2023   CO2 24 01/04/2023   TSH 3.000 06/18/2023   HGBA1C 5.6 01/04/2023     Assessment & Plan:  Annual physical exam Assessment & Plan: Doing well.  Preventative health care discussed.  Labs today.  Follow-up annually.   Hypothyroidism, unspecified type -     Levothyroxine  Sodium; Take 1 tablet (25 mcg total) by mouth daily before breakfast.  Dispense: 90 tablet; Refill: 3 -     TSH  History of gastric surgery -     CBC -     CMP14+EGFR -     Lipid panel -     Iron, TIBC and Ferritin Panel -     Vitamin B12 -     VITAMIN D  25 Hydroxy (Vit-D Deficiency, Fractures) -     Folate  Screening, lipid -     Lipid panel    Follow-up:  Annually  Jacqulyn Ahle DO [facility]

## 2024-04-23 ENCOUNTER — Ambulatory Visit: Payer: Self-pay | Admitting: Family Medicine

## 2024-04-23 LAB — LIPID PANEL
Chol/HDL Ratio: 2.1 ratio (ref 0.0–4.4)
Cholesterol, Total: 169 mg/dL (ref 100–199)
HDL: 82 mg/dL (ref >39)
LDL Chol Calc (NIH): 73 mg/dL (ref 0–99)
Triglycerides: 75 mg/dL (ref 0–149)
VLDL Cholesterol Cal: 14 mg/dL (ref 5–40)

## 2024-04-23 LAB — TSH: TSH: 2.44 u[IU]/mL (ref 0.450–4.500)

## 2024-04-23 LAB — IRON,TIBC AND FERRITIN PANEL
Ferritin: 111 ng/mL (ref 15–150)
Iron Saturation: 36 % (ref 15–55)
Iron: 114 ug/dL (ref 27–159)
Total Iron Binding Capacity: 315 ug/dL (ref 250–450)
UIBC: 201 ug/dL (ref 131–425)

## 2024-04-23 LAB — CMP14+EGFR
ALT: 11 IU/L (ref 0–32)
AST: 15 IU/L (ref 0–40)
Albumin: 4.4 g/dL (ref 3.8–4.9)
Alkaline Phosphatase: 61 IU/L (ref 49–135)
BUN/Creatinine Ratio: 22 (ref 9–23)
BUN: 18 mg/dL (ref 6–24)
Bilirubin Total: 0.6 mg/dL (ref 0.0–1.2)
CO2: 24 mmol/L (ref 20–29)
Calcium: 9.7 mg/dL (ref 8.7–10.2)
Chloride: 104 mmol/L (ref 96–106)
Creatinine, Ser: 0.82 mg/dL (ref 0.57–1.00)
Globulin, Total: 2.1 g/dL (ref 1.5–4.5)
Glucose: 75 mg/dL (ref 70–99)
Potassium: 4.5 mmol/L (ref 3.5–5.2)
Sodium: 139 mmol/L (ref 134–144)
Total Protein: 6.5 g/dL (ref 6.0–8.5)
eGFR: 86 mL/min/1.73 (ref >59)

## 2024-04-23 LAB — VITAMIN D 25 HYDROXY (VIT D DEFICIENCY, FRACTURES): Vit D, 25-Hydroxy: 49.8 ng/mL (ref 30.0–100.0)

## 2024-04-23 LAB — CBC
Hematocrit: 40.8 % (ref 34.0–46.6)
Hemoglobin: 13 g/dL (ref 11.1–15.9)
MCH: 30.1 pg (ref 26.6–33.0)
MCHC: 31.9 g/dL (ref 31.5–35.7)
MCV: 94 fL (ref 79–97)
Platelets: 204 x10E3/uL (ref 150–450)
RBC: 4.32 x10E6/uL (ref 3.77–5.28)
RDW: 12.6 % (ref 11.7–15.4)
WBC: 4.6 x10E3/uL (ref 3.4–10.8)

## 2024-04-23 LAB — FOLATE: Folate: 12.4 ng/mL (ref >3.0)

## 2024-04-23 LAB — VITAMIN B12: Vitamin B-12: >2000 pg/mL — ABNORMAL HIGH (ref 232–1245)

## 2024-04-27 DIAGNOSIS — M25511 Pain in right shoulder: Secondary | ICD-10-CM | POA: Diagnosis not present

## 2024-04-27 DIAGNOSIS — M6281 Muscle weakness (generalized): Secondary | ICD-10-CM | POA: Diagnosis not present

## 2024-04-27 DIAGNOSIS — M2569 Stiffness of other specified joint, not elsewhere classified: Secondary | ICD-10-CM | POA: Diagnosis not present

## 2024-04-29 DIAGNOSIS — M6281 Muscle weakness (generalized): Secondary | ICD-10-CM | POA: Diagnosis not present

## 2024-04-29 DIAGNOSIS — M25511 Pain in right shoulder: Secondary | ICD-10-CM | POA: Diagnosis not present

## 2024-04-29 DIAGNOSIS — M2569 Stiffness of other specified joint, not elsewhere classified: Secondary | ICD-10-CM | POA: Diagnosis not present

## 2024-05-04 DIAGNOSIS — M25511 Pain in right shoulder: Secondary | ICD-10-CM | POA: Diagnosis not present

## 2024-05-04 DIAGNOSIS — M6281 Muscle weakness (generalized): Secondary | ICD-10-CM | POA: Diagnosis not present

## 2024-05-04 DIAGNOSIS — M2569 Stiffness of other specified joint, not elsewhere classified: Secondary | ICD-10-CM | POA: Diagnosis not present

## 2024-05-08 ENCOUNTER — Encounter: Payer: Self-pay | Admitting: General Practice

## 2024-05-08 ENCOUNTER — Ambulatory Visit
Admission: RE | Admit: 2024-05-08 | Discharge: 2024-05-08 | Disposition: A | Discharge status: Home / Self Care | Source: Ambulatory Visit | Attending: Family Medicine | Admitting: Family Medicine

## 2024-05-08 ENCOUNTER — Encounter: Payer: Self-pay | Admitting: Family Medicine

## 2024-05-08 DIAGNOSIS — Z1231 Encounter for screening mammogram for malignant neoplasm of breast: Secondary | ICD-10-CM

## 2024-05-13 DIAGNOSIS — M25511 Pain in right shoulder: Secondary | ICD-10-CM | POA: Diagnosis not present

## 2024-05-25 DIAGNOSIS — M6281 Muscle weakness (generalized): Secondary | ICD-10-CM | POA: Diagnosis not present

## 2024-05-25 DIAGNOSIS — M2569 Stiffness of other specified joint, not elsewhere classified: Secondary | ICD-10-CM | POA: Diagnosis not present

## 2024-05-25 DIAGNOSIS — M25511 Pain in right shoulder: Secondary | ICD-10-CM | POA: Diagnosis not present

## 2024-05-27 DIAGNOSIS — M25511 Pain in right shoulder: Secondary | ICD-10-CM | POA: Diagnosis not present

## 2024-05-27 DIAGNOSIS — M2569 Stiffness of other specified joint, not elsewhere classified: Secondary | ICD-10-CM | POA: Diagnosis not present

## 2024-05-27 DIAGNOSIS — M6281 Muscle weakness (generalized): Secondary | ICD-10-CM | POA: Diagnosis not present

## 2024-05-28 ENCOUNTER — Encounter: Payer: Self-pay | Admitting: Adult Health

## 2024-05-28 ENCOUNTER — Ambulatory Visit: Admitting: Adult Health

## 2024-05-28 VITALS — BP 105/70 | HR 56 | Ht 66.0 in | Wt 164.0 lb

## 2024-05-28 DIAGNOSIS — N921 Excessive and frequent menstruation with irregular cycle: Secondary | ICD-10-CM

## 2024-05-28 DIAGNOSIS — N951 Menopausal and female climacteric states: Secondary | ICD-10-CM

## 2024-05-28 DIAGNOSIS — R102 Pelvic and perineal pain unspecified side: Secondary | ICD-10-CM | POA: Diagnosis not present

## 2024-05-28 MED ORDER — NORETHINDRONE ACETATE 5 MG PO TABS: ORAL_TABLET | ORAL | 0 refills | Status: DC

## 2024-05-28 NOTE — Progress Notes (Signed)
  Subjective:     Patient ID: Ashley Ray, female   DOB: 1971/02/05, 53 y.o.   MRN: [id number]  HPI Ashley Ray is a 53 year old white female, married, G1P1001, in complaining of bleeding since about 05/01/24 with some cramping, started out light and has gotten heavier. Has occasional hot flash and more headaches, and is waking up more. She is tired at the end of the day.     Component Value Date/Time   DIAGPAP  01/07/2023 1059    - Negative for intraepithelial lesion or malignancy (NILM)   DIAGPAP  04/27/2021 1559    - Negative for intraepithelial lesion or malignancy (NILM)   DIAGPAP  02/08/2020 0937    - Negative for intraepithelial lesion or malignancy (NILM)   HPVHIGH Negative 01/07/2023 1059   HPVHIGH Negative 04/27/2021 1559   HPVHIGH Negative 02/08/2020 0937   ADEQPAP  01/07/2023 1059    Satisfactory for evaluation; transformation zone component ABSENT.   ADEQPAP  04/27/2021 1559    Satisfactory for evaluation; transformation zone component PRESENT.   ADEQPAP  02/08/2020 0937    Satisfactory for evaluation; transformation zone component PRESENT.    PCP is Dr Bluford Review of Systems +bleeding since about 05/01/24 with some cramping.  Has occasional hot flash  + more headaches, +waking up more +tired at the end of the day Decrease sex drive   Reviewed past medical,surgical, social and family history. Reviewed medications and allergies.  Objective:   Physical Exam BP 105/70 (BP Location: Right Arm, Patient Position: Sitting, Cuff Size: Normal)   Pulse (!) 56   Ht 5' 6 (1.676 m)   Wt 164 lb (74.4 kg)   LMP 05/06/2024 (Exact Date)   BMI 26.47 kg/m     Skin warm and dry.Pelvic: external genitalia is normal in appearance no lesions, vagina: +blood,urethra has no lesions or masses noted, cervix:smooth and bulbous, uterus: normal size, shape and contour, non tender, no masses felt, adnexa: no masses or tenderness noted. Bladder is non tender and no masses felt.  Fall  risk is low  Upstream - 05/28/24 1436       Pregnancy Intention Screening   Does the patient want to become pregnant in the next year? No    Does the patient's partner want to become pregnant in the next year? No    Would the patient like to discuss contraceptive options today? No      Contraception Wrap Up   Current Method Vasectomy    End Method Vasectomy    Contraception Counseling Provided No         Examination chaperoned by Clarita Salt LPN  Assessment:     1. Menorrhagia with irregular cycle (Primary) +bleeding since about 05/01/24 with some cramping, started out light and has gotten heavier, use thick pad may change every 6-8 hours  Will rx aygestin to stop the bleeding Meds ordered this encounter  Medications   norethindrone (AYGESTIN) 5 MG tablet    Sig: Take 1 bid x 10 days    Dispense:  20 tablet    Refill:  0    Supervising Provider:   JAYNE MINDER H [[id number]]    2. Pelvic cramping  3. Perimenopause    Plan:     Return Monday as scheduled for physical

## 2024-06-01 ENCOUNTER — Other Ambulatory Visit (HOSPITAL_COMMUNITY)
Admission: RE | Admit: 2024-06-01 | Discharge: 2024-06-01 | Disposition: A | Discharge status: Home / Self Care | Source: Ambulatory Visit | Attending: Adult Health | Admitting: Adult Health

## 2024-06-01 ENCOUNTER — Ambulatory Visit (INDEPENDENT_AMBULATORY_CARE_PROVIDER_SITE_OTHER): Admitting: Adult Health

## 2024-06-01 ENCOUNTER — Encounter: Payer: Self-pay | Admitting: Adult Health

## 2024-06-01 VITALS — BP 106/63 | HR 74 | Ht 66.0 in | Wt 164.0 lb

## 2024-06-01 DIAGNOSIS — Z01419 Encounter for gynecological examination (general) (routine) without abnormal findings: Secondary | ICD-10-CM

## 2024-06-01 DIAGNOSIS — Z1151 Encounter for screening for human papillomavirus (HPV): Secondary | ICD-10-CM | POA: Diagnosis not present

## 2024-06-01 DIAGNOSIS — Z1211 Encounter for screening for malignant neoplasm of colon: Secondary | ICD-10-CM | POA: Diagnosis not present

## 2024-06-01 DIAGNOSIS — R102 Pelvic and perineal pain unspecified side: Secondary | ICD-10-CM

## 2024-06-01 DIAGNOSIS — Z1212 Encounter for screening for malignant neoplasm of rectum: Secondary | ICD-10-CM

## 2024-06-01 DIAGNOSIS — N921 Excessive and frequent menstruation with irregular cycle: Secondary | ICD-10-CM

## 2024-06-01 DIAGNOSIS — N951 Menopausal and female climacteric states: Secondary | ICD-10-CM

## 2024-06-01 MED ORDER — PREMARIN 0.625 MG/GM VA CREA: TOPICAL_CREAM | VAGINAL | 2 refills | Status: AC

## 2024-06-01 NOTE — Progress Notes (Signed)
 Patient ID: Ashley Ray, female   DOB: 06-21-1971, 53 y.o.   MRN: [id number] History of Present Illness: Ashley Ray is a 53 year old white female, married, G1P1001, in for well woman gyn exam and she requests a pap. She was seen last week for bleeding and it stopped with aygestin.  PCP is Dr Bluford    Current Medications, Allergies, Past Medical History, Past Surgical History, Family History and Social History were reviewed in [employer] electronic medical record.     Review of Systems: Patient denies any headaches, hearing loss, fatigue, blurred vision, shortness of breath, chest pain, abdominal pain, problems with bowel movements, urination, or intercourse(not currently active). No joint pain or mood swings.  +vaginal dryness   Physical Exam:BP 106/63 (BP Location: Left Arm, Patient Position: Sitting, Cuff Size: Normal)   Pulse 74   Ht 5' 6 (1.676 m)   Wt 164 lb (74.4 kg)   LMP 05/06/2024 (Exact Date)   BMI 26.47 kg/m   General:  Well developed, well nourished, no acute distress Skin:  Warm and dry Neck:  Midline trachea, normal thyroid , good ROM, no lymphadenopathy Lungs; Clear to auscultation bilaterally Breast:  No dominant palpable mass, retraction, or nipple discharge Cardiovascular: Regular rate and rhythm Abdomen:  Soft, non tender, no hepatosplenomegaly Pelvic:  External genitalia is normal in appearance, no lesions.  The vagina is normal in appearance. Urethra has no lesions or masses. The cervix is bulbous.Pap with HR HPV genotyping performed.  Uterus is felt to be normal size, shape, and contour.  No adnexal masses or tenderness noted.Bladder is non tender, no masses felt. Rectal: deferred Extremities/musculoskeletal:  No swelling or varicosities noted, no clubbing or cyanosis Psych:  No mood changes, alert and cooperative,seems happy AA is 0 Fall risk is low    06/01/2024   11:43 AM 04/22/2024    8:59 AM 01/07/2023   10:35 AM  Depression screen PHQ 2/9   Decreased Interest 0 0 0  Down, Depressed, Hopeless 0 0 0  PHQ - 2 Score 0 0 0  Altered sleeping 0 0 0  Tired, decreased energy 0 0 0  Change in appetite 0 0 0  Feeling bad or failure about yourself  0 0 0  Trouble concentrating 0 0 0  Moving slowly or fidgety/restless 0 0 0  Suicidal thoughts 0 0 0  PHQ-9 Score 0 0  0   Difficult doing work/chores  Not difficult at all      Data saved with a previous flowsheet row definition       06/01/2024   11:44 AM 04/22/2024    8:59 AM 01/07/2023   10:36 AM 04/27/2021    3:03 PM  GAD 7 : Generalized Anxiety Score  Nervous, Anxious, on Edge 0 0 0 0  Control/stop worrying 0 0 0 0  Worry too much - different things 0 0 0 0  Trouble relaxing 0 0 0 0  Restless 0 0 0 0  Easily annoyed or irritable 0 0 0 0  Afraid - awful might happen 0 0 0 0  Total GAD 7 Score 0 0 0 0  Anxiety Difficulty  Not difficult at all      Upstream - 06/01/24 1139       Pregnancy Intention Screening   Does the patient want to become pregnant in the next year? N/A    Does the patient's partner want to become pregnant in the next year? N/A    Would the patient like to  discuss contraceptive options today? N/A      Contraception Wrap Up   Current Method Vasectomy    End Method Vasectomy    Contraception Counseling Provided No           Examination chaperoned by Clarita Salt LPN  Impression and Plan: 1. Encounter for routine gynecological examination with Papanicolaou smear of cervix (Primary) Pap sent Pap in 3 years if negative  Physical in 1 year Labs with PCP Mammogram was negative 05/08/24  - Cytology - PAP( [facility])  2. Menorrhagia with irregular cycle Bleeding stopped with aygestin   3. Pelvic cramping Cramping stopped with aygestin  4. Screening for colorectal cancer Order placed for cologuard  - Cologuard  5. Vaginal dryness, menopausal +vaginal dryness Will rx PVC Meds ordered this encounter  Medications   conjugated estrogens   (PREMARIN ) vaginal cream    Sig: Use 0.5 gm in vagina at bedtime for 2 weeks then 2-3 x weekly    Dispense:  30 g    Refill:  2    Supervising Provider:   JAYNE MINDER H [[id number]]

## 2024-06-03 ENCOUNTER — Ambulatory Visit: Payer: Self-pay | Admitting: Adult Health

## 2024-06-03 LAB — CYTOLOGY - PAP
Comment: NEGATIVE
Diagnosis: NEGATIVE
High risk HPV: NEGATIVE

## 2024-06-10 DIAGNOSIS — M6281 Muscle weakness (generalized): Secondary | ICD-10-CM | POA: Diagnosis not present

## 2024-06-10 DIAGNOSIS — M25511 Pain in right shoulder: Secondary | ICD-10-CM | POA: Diagnosis not present

## 2024-06-10 DIAGNOSIS — M2569 Stiffness of other specified joint, not elsewhere classified: Secondary | ICD-10-CM | POA: Diagnosis not present

## 2024-06-24 DIAGNOSIS — M6281 Muscle weakness (generalized): Secondary | ICD-10-CM | POA: Diagnosis not present

## 2024-06-24 DIAGNOSIS — M25511 Pain in right shoulder: Secondary | ICD-10-CM | POA: Diagnosis not present

## 2024-06-24 DIAGNOSIS — M2569 Stiffness of other specified joint, not elsewhere classified: Secondary | ICD-10-CM | POA: Diagnosis not present

## 2024-06-27 DIAGNOSIS — Z1211 Encounter for screening for malignant neoplasm of colon: Secondary | ICD-10-CM | POA: Diagnosis not present

## 2024-06-27 DIAGNOSIS — Z1212 Encounter for screening for malignant neoplasm of rectum: Secondary | ICD-10-CM | POA: Diagnosis not present

## 2024-07-02 ENCOUNTER — Other Ambulatory Visit: Payer: Self-pay | Admitting: Adult Health

## 2024-07-02 ENCOUNTER — Other Ambulatory Visit: Payer: Self-pay | Admitting: Women's Health

## 2024-07-02 MED ORDER — FLUCONAZOLE 150 MG PO TABS: ORAL_TABLET | ORAL | 1 refills | Status: AC

## 2024-07-02 NOTE — Progress Notes (Signed)
 Refilled diflucan

## 2024-07-03 LAB — COLOGUARD: COLOGUARD: NEGATIVE

## 2024-08-17 ENCOUNTER — Other Ambulatory Visit: Payer: Self-pay | Admitting: Adult Health

## 2024-09-15 ENCOUNTER — Ambulatory Visit: Admitting: Allergy & Immunology
